# Patient Record
Sex: Male | Born: 1990 | Hispanic: No
Health system: Southern US, Community
[De-identification: ages and names within clinical notes are randomized; demographics above are authoritative.]

## PROBLEM LIST (undated history)

## (undated) DIAGNOSIS — I456 Pre-excitation syndrome: Principal | ICD-10-CM

## (undated) DIAGNOSIS — R002 Palpitations: Secondary | ICD-10-CM

## (undated) HISTORY — DX: Palpitations: R00.2

## (undated) HISTORY — DX: Pre-excitation syndrome: I45.6

## (undated) HISTORY — PX: ABLATION: SHX5711

---
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
## 2012-02-14 ENCOUNTER — Emergency Department (HOSPITAL_COMMUNITY)
Admission: EM | Admit: 2012-02-14 | Discharge: 2012-02-14 | Disposition: A | Discharge status: Home / Self Care | Payer: Self-pay | Attending: Emergency Medicine | Admitting: Emergency Medicine

## 2012-02-14 ENCOUNTER — Encounter (HOSPITAL_COMMUNITY): Payer: Self-pay

## 2012-02-14 DIAGNOSIS — J029 Acute pharyngitis, unspecified: Secondary | ICD-10-CM | POA: Insufficient documentation

## 2012-02-14 DIAGNOSIS — J3489 Other specified disorders of nose and nasal sinuses: Secondary | ICD-10-CM | POA: Insufficient documentation

## 2012-02-14 DIAGNOSIS — J40 Bronchitis, not specified as acute or chronic: Secondary | ICD-10-CM | POA: Insufficient documentation

## 2012-02-14 DIAGNOSIS — R05 Cough: Secondary | ICD-10-CM | POA: Insufficient documentation

## 2012-02-14 DIAGNOSIS — Z8679 Personal history of other diseases of the circulatory system: Secondary | ICD-10-CM | POA: Insufficient documentation

## 2012-02-14 DIAGNOSIS — R062 Wheezing: Secondary | ICD-10-CM | POA: Insufficient documentation

## 2012-02-14 DIAGNOSIS — R509 Fever, unspecified: Secondary | ICD-10-CM | POA: Insufficient documentation

## 2012-02-14 DIAGNOSIS — R059 Cough, unspecified: Secondary | ICD-10-CM | POA: Insufficient documentation

## 2012-02-14 MED ORDER — ALBUTEROL SULFATE HFA 108 (90 BASE) MCG/ACT IN AERS
2.0000 | INHALATION_SPRAY | RESPIRATORY_TRACT | Status: DC | PRN
  Administered 2012-02-14: 2 via RESPIRATORY_TRACT
  Filled 2012-02-14: qty 6.7

## 2012-02-14 MED ORDER — PREDNISONE 20 MG PO TABS
60.0000 mg | ORAL_TABLET | Freq: Once | ORAL | Status: AC
  Administered 2012-02-14: 60 mg via ORAL
  Filled 2012-02-14: qty 3

## 2012-02-14 NOTE — ED Provider Notes (Signed)
History     CSN: [id number]  Arrival date & time 02/14/12  2147   First MD Initiated Contact with Patient 02/14/12 2227      Chief Complaint  Patient presents with  . URI   HPI  History provided by the patient. Patient is a 21 year old male who presents with complaints of cough and wheezing for the past 2 days. Symptoms began gradually and have been associated with some rhinorrhea and occasional sore throat. Patient's girlfriend has recently been sick with similar URI type symptoms. He reports having some fevers and chills at home. He has been using Tylenol and ibuprofen occasionally. Last dose was around 4 PM. he has no prior history of asthma or breathing issues.     Past Medical History  Diagnosis Date  . Palpitations   . Wolff-Parkinson-White (WPW) syndrome 07/13/2010    Past Surgical History  Procedure Date  . Appendectomy sept 13 2007    Family History  Problem Relation Age of Onset  . Diabetes Mother     History  Substance Use Topics  . Smoking status: Never Smoker   . Smokeless tobacco: Not on file  . Alcohol Use: No      Review of Systems  Constitutional: Positive for fever and chills. Negative for diaphoresis and appetite change.  HENT: Positive for sore throat and rhinorrhea. Negative for ear pain and congestion.   Respiratory: Positive for cough and wheezing. Negative for shortness of breath.   Cardiovascular: Negative for chest pain and palpitations.  Gastrointestinal: Negative for nausea, vomiting and diarrhea.  Skin: Negative for rash.    Allergies  Review of patient's allergies indicates no known allergies.  Home Medications   Current Outpatient Rx  Name  Route  Sig  Dispense  Refill  . ACETAMINOPHEN 500 MG PO TABS   Oral   Take 1,000 mg by mouth every 6 (six) hours as needed. Pain         . IBUPROFEN 200 MG PO TABS   Oral   Take 400 mg by mouth every 6 (six) hours as needed. Pain           BP 131/75  Pulse 80  Temp 98.3 F  (36.8 C) (Oral)  Resp 16  SpO2 100%  Physical Exam  Nursing note and vitals reviewed. Constitutional: He is oriented to person, place, and time. He appears well-developed and well-nourished. No distress.  HENT:  Head: Normocephalic.  Neck: Normal range of motion. Neck supple.       No meningeal sign  Cardiovascular: Normal rate and regular rhythm.   No murmur heard. Pulmonary/Chest: Effort normal. No respiratory distress. He has wheezes. He has no rales.  Abdominal: Soft. There is no tenderness. There is no rebound and no guarding.  Neurological: He is alert and oriented to person, place, and time.  Skin: Skin is warm.  Psychiatric: He has a normal mood and affect. His behavior is normal.    ED Course  Procedures    1. Bronchitis       MDM  10:40PM patient seen and evaluated. Patient well-appearing and nontoxic. Patient with symptoms and exam consistent with bronchitis. We'll provide albuterol inhaler and give one dose of prednisone.        Angus Seller, [place] 02/15/12 [phone]

## 2012-02-14 NOTE — ED Notes (Signed)
Pt c/o wheezing that strated today at work.pt sts has not fell well x 2days.VSS.PWD

## 2012-02-14 NOTE — ED Notes (Signed)
Pt reports "feeling chest tightness and I was wheezing earlier today.'  Pt reports spitting up yellow sputum, sore throat, and chills/fever.

## 2012-02-15 NOTE — ED Provider Notes (Signed)
Medical screening examination/treatment/procedure(s) were performed by non-physician practitioner and as supervising physician I was immediately available for consultation/collaboration.    Gilma Bessette L Eutha Cude, MD 02/15/12 0304 

## 2013-11-11 ENCOUNTER — Encounter (HOSPITAL_COMMUNITY): Payer: Self-pay | Admitting: Emergency Medicine

## 2013-11-11 ENCOUNTER — Emergency Department (HOSPITAL_COMMUNITY): Payer: Medicaid Other

## 2013-11-11 ENCOUNTER — Emergency Department (HOSPITAL_COMMUNITY)
Admission: EM | Admit: 2013-11-11 | Discharge: 2013-11-11 | Disposition: A | Discharge status: Home / Self Care | Payer: Self-pay | Attending: Emergency Medicine | Admitting: Emergency Medicine

## 2013-11-11 DIAGNOSIS — R509 Fever, unspecified: Secondary | ICD-10-CM | POA: Insufficient documentation

## 2013-11-11 DIAGNOSIS — F17200 Nicotine dependence, unspecified, uncomplicated: Secondary | ICD-10-CM | POA: Insufficient documentation

## 2013-11-11 DIAGNOSIS — B9789 Other viral agents as the cause of diseases classified elsewhere: Secondary | ICD-10-CM | POA: Insufficient documentation

## 2013-11-11 DIAGNOSIS — IMO0001 Reserved for inherently not codable concepts without codable children: Secondary | ICD-10-CM | POA: Insufficient documentation

## 2013-11-11 DIAGNOSIS — Z8679 Personal history of other diseases of the circulatory system: Secondary | ICD-10-CM | POA: Insufficient documentation

## 2013-11-11 DIAGNOSIS — B349 Viral infection, unspecified: Secondary | ICD-10-CM

## 2013-11-11 DIAGNOSIS — M791 Myalgia, unspecified site: Secondary | ICD-10-CM

## 2013-11-11 LAB — URINALYSIS, ROUTINE W REFLEX MICROSCOPIC
BILIRUBIN URINE: NEGATIVE
Glucose, UA: NEGATIVE mg/dL
HGB URINE DIPSTICK: NEGATIVE
Ketones, ur: 15 mg/dL — AB
Leukocytes, UA: NEGATIVE
NITRITE: NEGATIVE
PH: 6 (ref 5.0–8.0)
Protein, ur: 30 mg/dL — AB
SPECIFIC GRAVITY, URINE: 1.039 — AB (ref 1.005–1.030)
Urobilinogen, UA: 1 mg/dL (ref 0.0–1.0)

## 2013-11-11 LAB — COMPREHENSIVE METABOLIC PANEL
ALBUMIN: 3.9 g/dL (ref 3.5–5.2)
ALK PHOS: 81 U/L (ref 39–117)
ALT: 13 U/L (ref 0–53)
AST: 14 U/L (ref 0–37)
Anion gap: 12 (ref 5–15)
BILIRUBIN TOTAL: 0.6 mg/dL (ref 0.3–1.2)
BUN: 12 mg/dL (ref 6–23)
CHLORIDE: 102 meq/L (ref 96–112)
CO2: 26 mEq/L (ref 19–32)
Calcium: 9.5 mg/dL (ref 8.4–10.5)
Creatinine, Ser: 0.98 mg/dL (ref 0.50–1.35)
GFR calc Af Amer: >90 mL/min (ref >90)
GFR calc non Af Amer: >90 mL/min (ref >90)
Glucose, Bld: 90 mg/dL (ref 70–99)
POTASSIUM: 4.4 meq/L (ref 3.7–5.3)
SODIUM: 140 meq/L (ref 137–147)
Total Protein: 7.5 g/dL (ref 6.0–8.3)

## 2013-11-11 LAB — RAPID STREP SCREEN (MED CTR MEBANE ONLY): Streptococcus, Group A Screen (Direct): NEGATIVE

## 2013-11-11 LAB — CBC WITH DIFFERENTIAL/PLATELET
BASOS ABS: 0 10*3/uL (ref 0.0–0.1)
BASOS PCT: 0 % (ref 0–1)
EOS ABS: 0 10*3/uL (ref 0.0–0.7)
EOS PCT: 0 % (ref 0–5)
HEMATOCRIT: 48 % (ref 39.0–52.0)
Hemoglobin: 16 g/dL (ref 13.0–17.0)
LYMPHS ABS: 2.1 10*3/uL (ref 0.7–4.0)
LYMPHS PCT: 23 % (ref 12–46)
MCH: 30.1 pg (ref 26.0–34.0)
MCHC: 33.3 g/dL (ref 30.0–36.0)
MCV: 90.2 fL (ref 78.0–100.0)
MONO ABS: 1.6 10*3/uL — AB (ref 0.1–1.0)
MONOS PCT: 18 % — AB (ref 3–12)
Neutro Abs: 5.4 10*3/uL (ref 1.7–7.7)
Neutrophils Relative %: 59 % (ref 43–77)
Platelets: 167 10*3/uL (ref 150–400)
RBC: 5.32 MIL/uL (ref 4.22–5.81)
RDW: 13.8 % (ref 11.5–15.5)
WBC: 9.1 10*3/uL (ref 4.0–10.5)

## 2013-11-11 LAB — URINE MICROSCOPIC-ADD ON

## 2013-11-11 MED ORDER — KETOROLAC TROMETHAMINE 60 MG/2ML IM SOLN
60.0000 mg | Freq: Once | INTRAMUSCULAR | Status: AC
  Administered 2013-11-11: 60 mg via INTRAMUSCULAR
  Filled 2013-11-11: qty 2

## 2013-11-11 MED ORDER — IBUPROFEN 800 MG PO TABS
800.0000 mg | ORAL_TABLET | Freq: Once | ORAL | Status: AC
Start: 2013-11-11 — End: 2013-11-11
  Administered 2013-11-11: 800 mg via ORAL
  Filled 2013-11-11: qty 2

## 2013-11-11 NOTE — Discharge Instructions (Signed)
Viral Infections You declined lumbar puncture today. Follow up with your doctor. Use tylenol or motrin as needed for fever or pain. Return to the ED if you develop new or worsening symptoms. A viral infection can be caused by different types of viruses.Most viral infections are not serious and resolve on their own. However, some infections may cause severe symptoms and may lead to further complications. SYMPTOMS Viruses can frequently cause:  Minor sore throat.  Aches and pains.  Headaches.  Runny nose.  Different types of rashes.  Watery eyes.  Tiredness.  Cough.  Loss of appetite.  Gastrointestinal infections, resulting in nausea, vomiting, and diarrhea. These symptoms do not respond to antibiotics because the infection is not caused by bacteria. However, you might catch a bacterial infection following the viral infection. This is sometimes called a "superinfection." Symptoms of such a bacterial infection may include:  Worsening sore throat with pus and difficulty swallowing.  Swollen neck glands.  Chills and a high or persistent fever.  Severe headache.  Tenderness over the sinuses.  Persistent overall ill feeling (malaise), muscle aches, and tiredness (fatigue).  Persistent cough.  Yellow, green, or brown mucus production with coughing. HOME CARE INSTRUCTIONS   Only take over-the-counter or prescription medicines for pain, discomfort, diarrhea, or fever as directed by your caregiver.  Drink enough water and fluids to keep your urine clear or pale yellow. Sports drinks can provide valuable electrolytes, sugars, and hydration.  Get plenty of rest and maintain proper nutrition. Soups and broths with crackers or rice are fine. SEEK IMMEDIATE MEDICAL CARE IF:   You have severe headaches, shortness of breath, chest pain, neck pain, or an unusual rash.  You have uncontrolled vomiting, diarrhea, or you are unable to keep down fluids.  You or your child has an oral  temperature above 102 F (38.9 C), not controlled by medicine.  Your baby is older than 3 months with a rectal temperature of 102 F (38.9 C) or higher.  Your baby is 373 months old or younger with a rectal temperature of 100.4 F (38 C) or higher. MAKE SURE YOU:   Understand these instructions.  Will watch your condition.  Will get help right away if you are not doing well or get worse. Document Released: 12/23/2004 Document Revised: 06/07/2011 Document Reviewed: 07/20/2010 [facility] Patient Information 2015 [employer], [place]. This information is not intended to replace advice given to you by your health care provider. Make sure you discuss any questions you have with your health care provider.

## 2013-11-11 NOTE — ED Notes (Signed)
Ambulated pt down hallway. Pt's gait was steady. No signs of dizziness.

## 2013-11-11 NOTE — ED Notes (Signed)
Patient presents with fever and body aches.  +diahhrea

## 2013-11-11 NOTE — ED Notes (Signed)
Gave pt water for fluid challenge 

## 2013-11-11 NOTE — ED Provider Notes (Signed)
CSN: [id number]     Arrival date & time 11/11/13  0350 History   First MD Initiated Contact with Patient 11/11/13 [phone]     Chief Complaint  Patient presents with  . Fever  . Generalized Body Aches     (Consider location/radiation/quality/duration/timing/severity/associated sxs/prior Treatment) HPI Comments: Patient presents with a two-day history of body aches, diarrhea and fever. States he's had 2 episodes of nonbloody diarrhea since yesterday. Complains of diffuse body aches worse in his lower 70s. Denies any focal weakness, numbness or tingling. Denies any nausea or vomiting. Denies abdominal pain. No chest pain or shortness of breath. He endorses a sore throat, dry cough and rhinorrhea. No sick contacts. No recent travel Travel. No other medical problems. Denies taking any regular medications. Denies any recent antibiotic use. Complains of body aches all over. endorses minimal headache. No neck pain or stiffness. Denies IV drug use.  The history is provided by the patient and a relative.    Past Medical History  Diagnosis Date  . Palpitations   . Wolff-Parkinson-White (WPW) syndrome 07/13/2010   Past Surgical History  Procedure Laterality Date  . Appendectomy  sept 13 2007   Family History  Problem Relation Age of Onset  . Diabetes Mother    History  Substance Use Topics  . Smoking status: Current Every Day Smoker  . Smokeless tobacco: Not on file  . Alcohol Use: Yes    Review of Systems  Constitutional: Positive for fever, activity change, appetite change and fatigue.  HENT: Positive for congestion, rhinorrhea and sore throat.   Respiratory: Negative for cough, chest tightness and shortness of breath.   Gastrointestinal: Positive for diarrhea. Negative for nausea, vomiting and abdominal pain.  Genitourinary: Negative for dysuria and hematuria.  Musculoskeletal: Positive for arthralgias and myalgias. Negative for neck pain and neck stiffness.  Neurological: Positive for  weakness and headaches. Negative for light-headedness.  A complete 10 system review of systems was obtained and all systems are negative except as noted in the HPI and PMH.      Allergies  Review of patient's allergies indicates no known allergies.  Home Medications   Prior to Admission medications   Medication Sig Start Date End Date Taking? Authorizing Provider  acetaminophen (TYLENOL) 500 MG tablet Take 1,000 mg by mouth every 6 (six) hours as needed. Pain   Yes Historical Provider, MD  ibuprofen (ADVIL,MOTRIN) 200 MG tablet Take 400 mg by mouth every 6 (six) hours as needed. Pain   Yes Historical Provider, MD   BP 107/67  Pulse 49  Temp(Src) 98.1 F (36.7 C) (Oral)  Resp 20  Ht 6\' 1"  (1.854 m)  Wt 185 lb (83.915 kg)  BMI 24.41 kg/m2  SpO2 100% Physical Exam  Nursing note and vitals reviewed. Constitutional: He is oriented to person, place, and time. He appears well-developed and well-nourished. No distress.  HENT:  Head: Normocephalic and atraumatic.  Mouth/Throat: No oropharyngeal exudate.  Mild erythema in oropharynx. No asymmetry  Eyes: Conjunctivae and EOM are normal. Pupils are equal, round, and reactive to light.  Neck: Normal range of motion. Neck supple.  No meningismus. Full range of motion without meningismus  Cardiovascular: Normal rate, regular rhythm, normal heart sounds and intact distal pulses.   No murmur heard. Pulmonary/Chest: Effort normal and breath sounds normal. No respiratory distress.  Abdominal: Soft. There is no tenderness. There is no rebound and no guarding.  Musculoskeletal: Normal range of motion. He exhibits no edema and no tenderness.  Neurological: He  is alert and oriented to person, place, and time. No cranial nerve deficit. He exhibits normal muscle tone. Coordination normal.  No ataxia on finger to nose bilaterally. No pronator drift. 5/5 strength throughout. CN 2-12 intact. Negative Romberg. Equal grip strength. Sensation intact. Gait  is normal.   Skin: Skin is warm.  Psychiatric: He has a normal mood and affect. His behavior is normal.    ED Course  Procedures (including critical care time) Labs Review Labs Reviewed  CBC WITH DIFFERENTIAL - Abnormal; Notable for the following:    Monocytes Relative 18 (*)    Monocytes Absolute 1.6 (*)    All other components within normal limits  URINALYSIS, ROUTINE W REFLEX MICROSCOPIC - Abnormal; Notable for the following:    Color, Urine AMBER (*)    Specific Gravity, Urine 1.039 (*)    Ketones, ur 15 (*)    Protein, ur 30 (*)    All other components within normal limits  RAPID STREP SCREEN  CULTURE, GROUP A STREP  COMPREHENSIVE METABOLIC PANEL  URINE MICROSCOPIC-ADD ON    Imaging Review Dg Chest 2 View  11/11/2013   CLINICAL DATA:  Generalized body aches and fever  EXAM: CHEST  2 VIEW  COMPARISON:  None.  FINDINGS: The heart size and mediastinal contours are within normal limits. Both lungs are clear. No pleural effusion or pneumothorax. The visualized skeletal structures are unremarkable.  IMPRESSION: Normal chest radiographs.   Electronically Signed   By: Amie Portland M.D.   On: 11/11/2013 08:54     EKG Interpretation   Date/Time:  Sunday November 11 2013 09:31:28 EDT Ventricular Rate:  49 PR Interval:  143 QRS Duration: 93 QT Interval:  448 QTC Calculation: 404 R Axis:   96 Text Interpretation:  Sinus bradycardia Borderline right axis deviation ST  elev, probable normal early repol pattern No previous ECGs available  Confirmed by Tytianna Greenley  MD, Sopheap Boehle ([id number]) on 11/11/2013 10:09:24 AM      MDM   Final diagnoses:  Viral syndrome  Myalgia   Two-day history of body aches, fever, diarrhea. Febrile at 101.6. Denies headache or meningismus. Nonfocal neuro exam. Abdomen soft and nontender  Labs unremarkable. No leukocytosis. Fever has improved.  Suspect viral syndrome. Patient with no headache or neck pain currently. No meningismus. Nonfocal neuro exam.  Discussed remote possibility of meningitis with the patient. He appears well with no meningismus and no headaches. Suspicion for bacterial meningitis is low. Vrial meningitis considered and a lumbar puncture offered to patient which he declines. He understands viral meningitis will be treated the same as a viral syndrome with supportive care. Patient agrees low suspicion for bacterial meningitis. He declines lumbar puncture at this time. He is awake, alert, has no head or neck pain no meningismus. His fever has resolved.  Discussed supportive care for viral syndrome. Tylenol and Motrin as needed for pain and fever. By mouth hydration at home. Followup with urgent care center this week. Return to ED with worsening headache, chills, confusion, change in mental status concerns.  BP 107/67  Pulse 49  Temp(Src) 98.1 F (36.7 C) (Oral)  Resp 20  Ht 6\' 1"  (1.854 m)  Wt 185 lb (83.915 kg)  BMI 24.41 kg/m2  SpO2 100%   Glynn Octave, MD 11/11/13 1529

## 2013-11-11 NOTE — ED Notes (Signed)
MD at bedside. 

## 2013-11-13 LAB — CULTURE, GROUP A STREP

## 2014-01-16 ENCOUNTER — Emergency Department (HOSPITAL_COMMUNITY)
Admission: EM | Admit: 2014-01-16 | Discharge: 2014-01-16 | Disposition: A | Discharge status: Home / Self Care | Payer: Medicaid Other | Attending: Emergency Medicine | Admitting: Emergency Medicine

## 2014-01-16 ENCOUNTER — Encounter (HOSPITAL_COMMUNITY): Payer: Self-pay | Admitting: Emergency Medicine

## 2014-01-16 DIAGNOSIS — Z720 Tobacco use: Secondary | ICD-10-CM | POA: Insufficient documentation

## 2014-01-16 DIAGNOSIS — Y929 Unspecified place or not applicable: Secondary | ICD-10-CM | POA: Insufficient documentation

## 2014-01-16 DIAGNOSIS — S56912A Strain of unspecified muscles, fascia and tendons at forearm level, left arm, initial encounter: Secondary | ICD-10-CM | POA: Insufficient documentation

## 2014-01-16 DIAGNOSIS — Y939 Activity, unspecified: Secondary | ICD-10-CM | POA: Insufficient documentation

## 2014-01-16 DIAGNOSIS — Z8679 Personal history of other diseases of the circulatory system: Secondary | ICD-10-CM | POA: Insufficient documentation

## 2014-01-16 DIAGNOSIS — X58XXXA Exposure to other specified factors, initial encounter: Secondary | ICD-10-CM | POA: Insufficient documentation

## 2014-01-16 MED ORDER — HYDROCODONE-ACETAMINOPHEN 5-325 MG PO TABS: 1.0000 | ORAL_TABLET | Freq: Four times a day (QID) | ORAL | Status: DC | PRN

## 2014-01-16 MED ORDER — IBUPROFEN 800 MG PO TABS: 800.0000 mg | ORAL_TABLET | Freq: Three times a day (TID) | ORAL | Status: DC

## 2014-01-16 NOTE — Discharge Instructions (Signed)
Call for a follow up appointment with a Family or Primary Care Provider.  Return if Symptoms worsen.   Take medication as prescribed.  Do not drink alcohol, operate heavy machinery while taking narcotic pain medication. Ice your arm 3-4 times a day.

## 2014-01-16 NOTE — ED Notes (Signed)
Patient states L arm pain from wrist to elbow.  Patient denies injury.   Patient states woke up with the pain.    No other symptoms.

## 2014-01-16 NOTE — ED Provider Notes (Signed)
CSN: [id number]     Arrival date & time 01/16/14  0716 History   None    Chief Complaint  Patient presents with  . Arm Pain     (Consider location/radiation/quality/duration/timing/severity/associated sxs/prior Treatment) HPI Comments: Patient presents with left arm myalgias since this morning. Reports discomfort upon waking. Discomfort worsened with movement and pressure. Denies injury to the area. Denies fever, chills. Denies paresthesias, rash.  Patient is a 23 y.o. male presenting with arm pain. The history is provided by the patient. No language interpreter was used.  Arm Pain This is a new problem. The current episode started today. The problem occurs constantly. The problem has been unchanged. Associated symptoms include myalgias. Pertinent negatives include no arthralgias, chills, fever, joint swelling or rash. He has tried nothing for the symptoms. The treatment provided no relief.    Past Medical History  Diagnosis Date  . Palpitations   . Wolff-Parkinson-White (WPW) syndrome 07/13/2010   Past Surgical History  Procedure Laterality Date  . Appendectomy  sept 13 2007   Family History  Problem Relation Age of Onset  . Diabetes Mother    History  Substance Use Topics  . Smoking status: Current Every Day Smoker -- 0.50 packs/day    Types: Cigarettes  . Smokeless tobacco: Not on file  . Alcohol Use: Yes     Comment: socially    Review of Systems  Constitutional: Negative for fever and chills.  Musculoskeletal: Positive for myalgias. Negative for arthralgias and joint swelling.  Skin: Negative for color change, rash and wound.      Allergies  Review of patient's allergies indicates no known allergies.  Home Medications   Prior to Admission medications   Medication Sig Start Date End Date Taking? Authorizing Provider  acetaminophen (TYLENOL) 500 MG tablet Take 1,000 mg by mouth every 6 (six) hours as needed. Pain    Historical Provider, MD  ibuprofen  (ADVIL,MOTRIN) 200 MG tablet Take 400 mg by mouth every 6 (six) hours as needed. Pain    Historical Provider, MD   BP 132/83  Pulse 63  Temp(Src) 98 F (36.7 C) (Oral)  Resp 20  SpO2 98% Physical Exam  Nursing note and vitals reviewed. Constitutional: He is oriented to person, place, and time. He appears well-developed and well-nourished. No distress.  HENT:  Head: Normocephalic and atraumatic.  Neck: Neck supple.  Cardiovascular: Normal rate and regular rhythm.   Pulses:      Radial pulses are 2+ on the right side, and 2+ on the left side.  Pulmonary/Chest: Effort normal. No respiratory distress.  Musculoskeletal:       Left forearm: He exhibits tenderness. He exhibits no bony tenderness, no swelling, no edema, no deformity and no laceration.       Arms: Left forearm: mild soft tissue tenderness with palpation. No overlying erythema or rash. Full ROM of elbow and wrist. Compartments soft, normal sensation distally. Good cap refill.  Neurological: He is oriented to person, place, and time.  Skin: Skin is warm and dry. He is not diaphoretic.  Psychiatric: He has a normal mood and affect. His behavior is normal.    ED Course  Procedures (including critical care time) Labs Review Labs Reviewed - No data to display  Imaging Review No results found.   EKG Interpretation None      MDM   Final diagnoses:  Muscle strain of forearm, left, initial encounter   Patient presents with left forearm soft tissue discomfort worsened with movement and palpation.  No sign of infectious process. Neurovascularly intact distally. Good range of motion. Likely muscle strain, plan to treat with anti-inflammatories, referral to hand specialist for further evaluation of discomfort. No bony discomfort or obvious abnormality, don't feel x-rays indicated at this time. Discussed treatment plan with the patient. Return precautions given. Reports understanding and no other concerns at this time.  Patient  is stable for discharge at this time. Meds given in ED:  Medications - No data to display  Discharge Medication List as of 01/16/2014  8:26 AM    START taking these medications   Details  HYDROcodone-acetaminophen (NORCO/VICODIN) 5-325 MG per tablet Take 1 tablet by mouth every 6 (six) hours as needed for moderate pain or severe pain., Starting 01/16/2014, Until Discontinued, Print    !! ibuprofen (ADVIL,MOTRIN) 800 MG tablet Take 1 tablet (800 mg total) by mouth 3 (three) times daily., Starting 01/16/2014, Until Discontinued, Print     !! - Potential duplicate medications found. Please discuss with provider.        Mellody DrownLauren Jamarkus Lisbon, PA-C 01/16/14 1624

## 2014-01-17 NOTE — ED Provider Notes (Signed)
Medical screening examination/treatment/procedure(s) were performed by non-physician practitioner and as supervising physician I was immediately available for consultation/collaboration.   EKG Interpretation None       Derwood KaplanAnkit Newman Waren, MD 01/17/14 [phone]

## 2015-03-28 ENCOUNTER — Encounter (HOSPITAL_COMMUNITY): Payer: Self-pay

## 2015-03-28 ENCOUNTER — Emergency Department (HOSPITAL_COMMUNITY)
Admission: EM | Admit: 2015-03-28 | Discharge: 2015-03-28 | Disposition: A | Discharge status: Home / Self Care | Payer: Medicaid Other | Attending: Emergency Medicine | Admitting: Emergency Medicine

## 2015-03-28 ENCOUNTER — Emergency Department (HOSPITAL_COMMUNITY): Payer: Medicaid Other

## 2015-03-28 DIAGNOSIS — Y998 Other external cause status: Secondary | ICD-10-CM | POA: Insufficient documentation

## 2015-03-28 DIAGNOSIS — Y92410 Unspecified street and highway as the place of occurrence of the external cause: Secondary | ICD-10-CM | POA: Insufficient documentation

## 2015-03-28 DIAGNOSIS — F17210 Nicotine dependence, cigarettes, uncomplicated: Secondary | ICD-10-CM | POA: Insufficient documentation

## 2015-03-28 DIAGNOSIS — S99922A Unspecified injury of left foot, initial encounter: Secondary | ICD-10-CM | POA: Insufficient documentation

## 2015-03-28 DIAGNOSIS — S93402A Sprain of unspecified ligament of left ankle, initial encounter: Secondary | ICD-10-CM

## 2015-03-28 DIAGNOSIS — Y9339 Activity, other involving climbing, rappelling and jumping off: Secondary | ICD-10-CM | POA: Insufficient documentation

## 2015-03-28 DIAGNOSIS — Z8679 Personal history of other diseases of the circulatory system: Secondary | ICD-10-CM | POA: Insufficient documentation

## 2015-03-28 DIAGNOSIS — Z791 Long term (current) use of non-steroidal anti-inflammatories (NSAID): Secondary | ICD-10-CM | POA: Insufficient documentation

## 2015-03-28 NOTE — ED Provider Notes (Signed)
CSN: [id number]     Arrival date & time 03/28/15  1121 History  By signing my name below, I, Marica OtterNusrat Rahman, attest that this documentation has been prepared under the direction and in the presence of Teressa LowerVrinda Daryana Whirley, NP. Electronically Signed: Marica OtterNusrat Rahman, ED Scribe. 03/28/2015. 11:36 AM.   Chief Complaint  Patient presents with  . Ankle Pain   The history is provided by the patient. No language interpreter was used.   PCP: Dorrene GermanAVBUERE,EDWIN A, MD HPI Comments: Patrick Buck is a 24 y.o. male, with PMHx noted below, who presents to the Emergency Department complaining of traumatic, worsening, constant, left ankle and left foot pain with associated swelling onset this morning. Pt reports he leaped over a vehicle ([employer]) and landed on both feet yesterday, however, the pain and swelling did not begin until this morning. Pt denies any chronic health conditions. Pt denies any other Sx at this time.   Past Medical History  Diagnosis Date  . Palpitations   . Wolff-Parkinson-White (WPW) syndrome 07/13/2010   Past Surgical History  Procedure Laterality Date  . Appendectomy  sept 13 2007  . Ablation      heart ablation 2011   Family History  Problem Relation Age of Onset  . Diabetes Mother    Social History  Substance Use Topics  . Smoking status: Current Every Day Smoker -- 0.50 packs/day    Types: Cigarettes  . Smokeless tobacco: None  . Alcohol Use: Yes     Comment: socially    Review of Systems  Constitutional: Negative for fever.  Musculoskeletal: Positive for joint swelling and arthralgias (left ankle and foot pain).  All other systems reviewed and are negative.  Allergies  Review of patient's allergies indicates no known allergies.  Home Medications   Prior to Admission medications   Medication Sig Start Date End Date Taking? Authorizing Provider  acetaminophen (TYLENOL) 500 MG tablet Take 1,000 mg by mouth every 6 (six) hours as needed. Pain   Yes Historical Provider, MD   ibuprofen (ADVIL,MOTRIN) 200 MG tablet Take 400 mg by mouth every 6 (six) hours as needed. Pain   Yes Historical Provider, MD  ibuprofen (ADVIL,MOTRIN) 800 MG tablet Take 1 tablet (800 mg total) by mouth 3 (three) times daily. 01/16/14  Yes Mellody DrownLauren Parker, PA-C  HYDROcodone-acetaminophen (NORCO/VICODIN) 5-325 MG per tablet Take 1 tablet by mouth every 6 (six) hours as needed for moderate pain or severe pain. 01/16/14   Mellody DrownLauren Parker, PA-C   Triage Vitals: BP 117/72 mmHg  Pulse 75  Temp(Src) 98.6 F (37 C) (Oral)  Resp 14  Ht 6' (1.829 m)  Wt 200 lb (90.719 kg)  BMI 27.12 kg/m2  SpO2 100% Physical Exam  Constitutional: He is oriented to person, place, and time. He appears well-developed and well-nourished.  HENT:  Head: Normocephalic.  Eyes: EOM are normal.  Neck: Normal range of motion.  Pulmonary/Chest: Effort normal.  Abdominal: He exhibits no distension.  Musculoskeletal: Normal range of motion.  Tender on the left lateral ankle and left lateral foot. Pulses intact  Neurological: He is alert and oriented to person, place, and time.  Psychiatric: He has a normal mood and affect.  Nursing note and vitals reviewed.  ED Course  Procedures (including critical care time) DIAGNOSTIC STUDIES: Oxygen Saturation is 100% on ra, nl by my interpretation.    COORDINATION OF CARE: 11:33 AM: Discussed treatment plan which includes imaging with pt at bedside; patient verbalizes understanding and agrees with treatment plan.  Imaging Review  Dg Ankle Complete Left  03/28/2015  CLINICAL DATA:  Left ankle injury, pain and swelling. EXAM: LEFT ANKLE COMPLETE - 3+ VIEW COMPARISON:  None. FINDINGS: There is no evidence of fracture, dislocation, or joint effusion. There is no evidence of arthropathy or other focal bone abnormality. Soft tissues are unremarkable. IMPRESSION: Negative. Electronically Signed   By: Bary Richard M.D.   On: 03/28/2015 12:21   Dg Foot Complete Left  03/28/2015   CLINICAL DATA:  Left foot injury last night, pain and swelling. EXAM: LEFT FOOT - COMPLETE 3+ VIEW COMPARISON:  None. FINDINGS: There is no evidence of fracture or dislocation. There is no evidence of arthropathy or other focal bone abnormality. Soft tissues are unremarkable. IMPRESSION: Negative. Electronically Signed   By: Bary Richard M.D.   On: 03/28/2015 12:22   I have personally reviewed and evaluated these images as part of my medical decision-making.  MDM   Final diagnoses:  Ankle sprain, left, initial encounter    No acute bony abnormality noted. Pt given aso and crutches and ortho referral. Neurovascularly intact  I personally performed the services described in this documentation, which was scribed in my presence. The recorded information has been reviewed and is accurate.   Teressa Lower, NP 03/28/15 1228  Laurence Spates, MD 03/29/15 [phone]

## 2015-03-28 NOTE — ED Notes (Signed)
Please see provider note for assessment.  

## 2015-03-28 NOTE — ED Notes (Signed)
Pt. Presents with complaint of L ankle and foot pain starting this AM. Pt. States he jumped over a car last night as a bet and pain and swelling did not begin until this AM.

## 2015-03-28 NOTE — Discharge Instructions (Signed)
Ankle Sprain  An ankle sprain is an injury to the strong, fibrous tissues (ligaments) that hold the bones of your ankle joint together.   CAUSES  An ankle sprain is usually caused by a fall or by twisting your ankle. Ankle sprains most commonly occur when you step on the outer edge of your foot, and your ankle turns inward. People who participate in sports are more prone to these types of injuries.   SYMPTOMS    Pain in your ankle. The pain may be present at rest or only when you are trying to stand or walk.   Swelling.   Bruising. Bruising may develop immediately or within 1 to 2 days after your injury.   Difficulty standing or walking, particularly when turning corners or changing directions.  DIAGNOSIS   Your caregiver will ask you details about your injury and perform a physical exam of your ankle to determine if you have an ankle sprain. During the physical exam, your caregiver will press on and apply pressure to specific areas of your foot and ankle. Your caregiver will try to move your ankle in certain ways. An X-ray exam may be done to be sure a bone was not broken or a ligament did not separate from one of the bones in your ankle (avulsion fracture).   TREATMENT   Certain types of braces can help stabilize your ankle. Your caregiver can make a recommendation for this. Your caregiver may recommend the use of medicine for pain. If your sprain is severe, your caregiver may refer you to a surgeon who helps to restore function to parts of your skeletal system (orthopedist) or a physical therapist.  HOME CARE INSTRUCTIONS    Apply ice to your injury for 1-2 days or as directed by your caregiver. Applying ice helps to reduce inflammation and pain.    Put ice in a plastic bag.    Place a towel between your skin and the bag.    Leave the ice on for 15-20 minutes at a time, every 2 hours while you are awake.   Only take over-the-counter or prescription medicines for pain, discomfort, or fever as directed by  your caregiver.   Elevate your injured ankle above the level of your heart as much as possible for 2-3 days.   If your caregiver recommends crutches, use them as instructed. Gradually put weight on the affected ankle. Continue to use crutches or a cane until you can walk without feeling pain in your ankle.   If you have a plaster splint, wear the splint as directed by your caregiver. Do not rest it on anything harder than a pillow for the first 24 hours. Do not put weight on it. Do not get it wet. You may take it off to take a shower or bath.   You may have been given an elastic bandage to wear around your ankle to provide support. If the elastic bandage is too tight (you have numbness or tingling in your foot or your foot becomes cold and blue), adjust the bandage to make it comfortable.   If you have an air splint, you may blow more air into it or let air out to make it more comfortable. You may take your splint off at night and before taking a shower or bath. Wiggle your toes in the splint several times per day to decrease swelling.  SEEK MEDICAL CARE IF:    You have rapidly increasing bruising or swelling.   Your toes feel   extremely cold or you lose feeling in your foot.   Your pain is not relieved with medicine.  SEEK IMMEDIATE MEDICAL CARE IF:   Your toes are numb or blue.   You have severe pain that is increasing.  MAKE SURE YOU:    Understand these instructions.   Will watch your condition.   Will get help right away if you are not doing well or get worse.     This information is not intended to replace advice given to you by your health care provider. Make sure you discuss any questions you have with your health care provider.     Document Released: 03/15/2005 Document Revised: 04/05/2014 Document Reviewed: 03/27/2011  [employer]vier Interactive Patient Education 2016 [employer]

## 2015-09-22 ENCOUNTER — Encounter (HOSPITAL_BASED_OUTPATIENT_CLINIC_OR_DEPARTMENT_OTHER): Payer: Self-pay | Admitting: *Deleted

## 2015-09-22 ENCOUNTER — Emergency Department (HOSPITAL_BASED_OUTPATIENT_CLINIC_OR_DEPARTMENT_OTHER)
Admission: EM | Admit: 2015-09-22 | Discharge: 2015-09-22 | Disposition: A | Discharge status: Home / Self Care | Payer: Self-pay | Attending: Emergency Medicine | Admitting: Emergency Medicine

## 2015-09-22 ENCOUNTER — Emergency Department (HOSPITAL_BASED_OUTPATIENT_CLINIC_OR_DEPARTMENT_OTHER): Payer: Self-pay

## 2015-09-22 DIAGNOSIS — J181 Lobar pneumonia, unspecified organism: Secondary | ICD-10-CM | POA: Insufficient documentation

## 2015-09-22 DIAGNOSIS — Z79899 Other long term (current) drug therapy: Secondary | ICD-10-CM | POA: Insufficient documentation

## 2015-09-22 DIAGNOSIS — F17210 Nicotine dependence, cigarettes, uncomplicated: Secondary | ICD-10-CM | POA: Insufficient documentation

## 2015-09-22 DIAGNOSIS — J189 Pneumonia, unspecified organism: Secondary | ICD-10-CM

## 2015-09-22 LAB — RAPID STREP SCREEN (MED CTR MEBANE ONLY): Streptococcus, Group A Screen (Direct): NEGATIVE

## 2015-09-22 MED ORDER — DOXYCYCLINE HYCLATE 100 MG PO TABS
100.0000 mg | ORAL_TABLET | Freq: Once | ORAL | Status: AC
  Administered 2015-09-22: 100 mg via ORAL
  Filled 2015-09-22: qty 1

## 2015-09-22 MED ORDER — IBUPROFEN 800 MG PO TABS
800.0000 mg | ORAL_TABLET | Freq: Once | ORAL | Status: AC
  Administered 2015-09-22: 800 mg via ORAL
  Filled 2015-09-22: qty 1

## 2015-09-22 MED ORDER — DOXYCYCLINE HYCLATE 100 MG PO CAPS: 100.0000 mg | ORAL_CAPSULE | Freq: Two times a day (BID) | ORAL | Status: DC

## 2015-09-22 NOTE — ED Notes (Signed)
[facility], NAD, calm, interactive, resps e/u, speaking in clear complete sentences.

## 2015-09-22 NOTE — ED Notes (Signed)
Pt c/o flu like symptoms x 3 days, body aches , fever sore throat congestion

## 2015-09-22 NOTE — ED Provider Notes (Signed)
CSN: [id number]     Arrival date & time 09/22/15  0002 History  By signing my name below, I, Rosario AdieWilliam Andrew Hiatt, attest that this documentation has been prepared under the direction and in the presence of Paula LibraJohn Liora Myles, MD.  Electronically Signed: Rosario AdieWilliam Andrew Hiatt, ED Scribe. 09/22/2015. 12:39 AM.   Chief Complaint  Patient presents with  . Generalized Body Aches   The history is provided by the patient. No language interpreter was used.   HPI Comments: Patrick Buck is a 25 y.o. male who presents to the Emergency Department with a three-day history of body aches, sore throat (that has partially resolved), subjective fever, chills, productive cough with green and yellow sputum, mild shortness of breath and loss of appetite. He has tried taking acetaminophen, Motrin, and Theraflu Without adequate relief. He denies nausea, vomiting or diarrhea.   Past Medical History  Diagnosis Date  . Palpitations   . Wolff-Parkinson-White (WPW) syndrome 07/13/2010   Past Surgical History  Procedure Laterality Date  . Appendectomy  sept 13 2007  . Ablation      heart ablation 2011   Family History  Problem Relation Age of Onset  . Diabetes Mother    Social History  Substance Use Topics  . Smoking status: Current Every Day Smoker -- 0.50 packs/day    Types: Cigarettes  . Smokeless tobacco: None  . Alcohol Use: Yes     Comment: socially    Review of Systems A complete 10 system review of systems was obtained and all systems are negative except as noted in the HPI and PMH.  Allergies  Review of patient's allergies indicates no known allergies.  Home Medications   Prior to Admission medications   Medication Sig Start Date End Date Taking? Authorizing Provider  ibuprofen (ADVIL,MOTRIN) 200 MG tablet Take 400 mg by mouth every 6 (six) hours as needed. Pain   Yes Historical Provider, MD  Phenylephrine-Pheniramine-DM [facility](THERAFLU COLD & COUGH PO) Take by mouth.   Yes Historical Provider, MD   Pseudoephedrine-APAP-DM (DAYQUIL MULTI-SYMPTOM PO) Take by mouth.   Yes Historical Provider, MD  acetaminophen (TYLENOL) 500 MG tablet Take 1,000 mg by mouth every 6 (six) hours as needed. Pain    Historical Provider, MD  ibuprofen (ADVIL,MOTRIN) 800 MG tablet Take 1 tablet (800 mg total) by mouth 3 (three) times daily. 01/16/14   Lauren Parker, PA-C   BP 114/63 mmHg  Pulse 68  Temp(Src) 100 F (37.8 C) (Oral)  Resp 16  Ht 6' (1.829 m)  Wt 175 lb (79.379 kg)  BMI 23.73 kg/m2  SpO2 99%   Physical Exam General: Well-developed, well-nourished male in no acute distress; appearance consistent with age of record HENT: normocephalic; atraumatic; pharyngeal erythema with slight left tonsillar exudate Eyes: pupils equal, round and reactive to light; extraocular muscles intact Neck: supple; no lymphadenopathy  Heart: regular rate and rhythm; no murmurs, rubs or gallops Lungs: clear to auscultation bilaterally; increased breath sounds in left lower lobe Abdomen: soft; nondistended; nontender; no masses or hepatosplenomegaly; bowel sounds present Extremities: No deformity; full range of motion; pulses normal Neurologic: Awake, alert and oriented; motor function intact in all extremities and symmetric; no facial droop Skin: Warm and dry Psychiatric: Normal mood and affect  ED Course  Procedures (including critical care time)   MDM   Nursing notes and vitals signs, including pulse oximetry, reviewed.  Summary of this visit's results, reviewed by myself:  Labs:  Results for orders placed or performed during the hospital encounter of  09/22/15 (from the past 24 hour(s))  Rapid strep screen     Status: None   Collection Time: 09/22/15 12:30 AM  Result Value Ref Range   Streptococcus, Group A Screen (Direct) NEGATIVE NEGATIVE    Imaging Studies: Dg Chest 2 View  09/22/2015  CLINICAL DATA:  25 year old male with fever and cough EXAM: CHEST  2 VIEW COMPARISON:  Chest radiograph dated  08/16 FINDINGS: Two views of the chest demonstrate focal area of increased density in the left lower lobe posteriorly, likely in the superior segment most compatible with developing pneumonia. Clinical correlation and follow-up recommended. The right lung is clear. There is no pleural effusion or pneumothorax. The cardiac silhouette is within normal limits. No acute osseous pathology. IMPRESSION: Left lower lobe pneumonia.  Follow-up recommended. Electronically Signed   By: Elgie CollardArash  Radparvar M.D.   On: 09/22/2015 01:46   Suspect viral URI with superimposed bacterial pneumonia.  Final diagnoses:  Left lower lobe pneumonia   I personally performed the services described in this documentation, which was scribed in my presence. The recorded information has been reviewed and is accurate.     Paula LibraJohn Libia Fazzini, MD 09/22/15 [phone]

## 2015-09-24 LAB — CULTURE, GROUP A STREP (THRC)

## 2015-10-02 ENCOUNTER — Emergency Department (HOSPITAL_COMMUNITY)
Admission: EM | Admit: 2015-10-02 | Discharge: 2015-10-03 | Disposition: A | Discharge status: Home / Self Care | Payer: Self-pay | Attending: Emergency Medicine | Admitting: Emergency Medicine

## 2015-10-02 ENCOUNTER — Emergency Department (HOSPITAL_COMMUNITY): Payer: Self-pay

## 2015-10-02 ENCOUNTER — Encounter (HOSPITAL_COMMUNITY): Payer: Self-pay | Admitting: *Deleted

## 2015-10-02 ENCOUNTER — Encounter (HOSPITAL_COMMUNITY)
Admission: EM | Disposition: A | Discharge status: Home / Self Care | Payer: Self-pay | Source: Home / Self Care | Attending: Emergency Medicine

## 2015-10-02 ENCOUNTER — Emergency Department (HOSPITAL_COMMUNITY): Payer: Self-pay | Admitting: Anesthesiology

## 2015-10-02 DIAGNOSIS — W260XXA Contact with knife, initial encounter: Secondary | ICD-10-CM | POA: Insufficient documentation

## 2015-10-02 DIAGNOSIS — F17210 Nicotine dependence, cigarettes, uncomplicated: Secondary | ICD-10-CM | POA: Insufficient documentation

## 2015-10-02 DIAGNOSIS — S56522A Laceration of other extensor muscle, fascia and tendon at forearm level, left arm, initial encounter: Secondary | ICD-10-CM | POA: Insufficient documentation

## 2015-10-02 DIAGNOSIS — S51812A Laceration without foreign body of left forearm, initial encounter: Secondary | ICD-10-CM

## 2015-10-02 HISTORY — PX: I&D EXTREMITY: SHX5045

## 2015-10-02 LAB — CBC WITH DIFFERENTIAL/PLATELET
BASOS ABS: 0.1 10*3/uL (ref 0.0–0.1)
Basophils Relative: 0 %
EOS ABS: 0.1 10*3/uL (ref 0.0–0.7)
EOS PCT: 0 %
HCT: 49.6 % (ref 39.0–52.0)
Hemoglobin: 17.2 g/dL — ABNORMAL HIGH (ref 13.0–17.0)
LYMPHS PCT: 17 %
Lymphs Abs: 3.1 10*3/uL (ref 0.7–4.0)
MCH: 30.3 pg (ref 26.0–34.0)
MCHC: 34.7 g/dL (ref 30.0–36.0)
MCV: 87.5 fL (ref 78.0–100.0)
MONO ABS: 0.9 10*3/uL (ref 0.1–1.0)
Monocytes Relative: 5 %
Neutro Abs: 14 10*3/uL — ABNORMAL HIGH (ref 1.7–7.7)
Neutrophils Relative %: 78 %
PLATELETS: 272 10*3/uL (ref 150–400)
RBC: 5.67 MIL/uL (ref 4.22–5.81)
RDW: 13 % (ref 11.5–15.5)
WBC: 18 10*3/uL — ABNORMAL HIGH (ref 4.0–10.5)

## 2015-10-02 LAB — BASIC METABOLIC PANEL
ANION GAP: 9 (ref 5–15)
BUN: 9 mg/dL (ref 6–20)
CALCIUM: 9.6 mg/dL (ref 8.9–10.3)
CO2: 23 mmol/L (ref 22–32)
Chloride: 106 mmol/L (ref 101–111)
Creatinine, Ser: 0.89 mg/dL (ref 0.61–1.24)
GFR calc Af Amer: >60 mL/min (ref >60)
GLUCOSE: 129 mg/dL — AB (ref 65–99)
Potassium: 3.3 mmol/L — ABNORMAL LOW (ref 3.5–5.1)
SODIUM: 138 mmol/L (ref 135–145)

## 2015-10-02 SURGERY — IRRIGATION AND DEBRIDEMENT EXTREMITY
Anesthesia: General | Site: Arm Upper | Laterality: Left

## 2015-10-02 MED ORDER — LIDOCAINE-EPINEPHRINE (PF) 2 %-1:200000 IJ SOLN
20.0000 mL | Freq: Once | INTRAMUSCULAR | Status: DC
  Filled 2015-10-02: qty 20

## 2015-10-02 MED ORDER — SUCCINYLCHOLINE CHLORIDE 20 MG/ML IJ SOLN
INTRAMUSCULAR | Status: DC | PRN
Start: 2015-10-02 — End: 2015-10-03
  Administered 2015-10-02: 100 mg via INTRAVENOUS

## 2015-10-02 MED ORDER — DEXAMETHASONE SODIUM PHOSPHATE 4 MG/ML IJ SOLN
INTRAMUSCULAR | Status: DC | PRN
  Administered 2015-10-02: 10 mg via INTRAVENOUS

## 2015-10-02 MED ORDER — FENTANYL CITRATE (PF) 250 MCG/5ML IJ SOLN
INTRAMUSCULAR | Status: AC
  Filled 2015-10-02: qty 5

## 2015-10-02 MED ORDER — TETANUS-DIPHTH-ACELL PERTUSSIS 5-2.5-18.5 LF-MCG/0.5 IM SUSP
0.5000 mL | Freq: Once | INTRAMUSCULAR | Status: AC
  Administered 2015-10-02: 0.5 mL via INTRAMUSCULAR
  Filled 2015-10-02: qty 0.5

## 2015-10-02 MED ORDER — SUCCINYLCHOLINE CHLORIDE 200 MG/10ML IV SOSY
PREFILLED_SYRINGE | INTRAVENOUS | Status: AC
  Filled 2015-10-02: qty 10

## 2015-10-02 MED ORDER — PROPOFOL 10 MG/ML IV BOLUS
INTRAVENOUS | Status: DC | PRN
  Administered 2015-10-02: 50 mg via INTRAVENOUS
  Administered 2015-10-02: 200 mg via INTRAVENOUS

## 2015-10-02 MED ORDER — BUPIVACAINE-EPINEPHRINE (PF) 0.25% -1:200000 IJ SOLN
INTRAMUSCULAR | Status: AC
  Filled 2015-10-02: qty 30

## 2015-10-02 MED ORDER — CEFAZOLIN SODIUM 1 G IJ SOLR
INTRAMUSCULAR | Status: DC | PRN
  Administered 2015-10-02: 2 g via INTRAMUSCULAR

## 2015-10-02 MED ORDER — HYDROMORPHONE HCL 1 MG/ML IJ SOLN
1.0000 mg | Freq: Once | INTRAMUSCULAR | Status: AC
  Administered 2015-10-02: 1 mg via INTRAVENOUS
  Filled 2015-10-02: qty 1

## 2015-10-02 MED ORDER — LACTATED RINGERS IV SOLN
INTRAVENOUS | Status: DC | PRN
  Administered 2015-10-02 (×2): via INTRAVENOUS

## 2015-10-02 MED ORDER — ONDANSETRON HCL 4 MG/2ML IJ SOLN
INTRAMUSCULAR | Status: DC | PRN
  Administered 2015-10-02: 4 mg via INTRAVENOUS

## 2015-10-02 MED ORDER — ONDANSETRON HCL 4 MG/2ML IJ SOLN
4.0000 mg | Freq: Once | INTRAMUSCULAR | Status: AC
  Administered 2015-10-02: 4 mg via INTRAVENOUS
  Filled 2015-10-02: qty 2

## 2015-10-02 MED ORDER — BUPIVACAINE-EPINEPHRINE (PF) 0.5% -1:200000 IJ SOLN
INTRAMUSCULAR | Status: AC
  Filled 2015-10-02: qty 30

## 2015-10-02 MED ORDER — SODIUM CHLORIDE 0.9 % IV BOLUS (SEPSIS)
1000.0000 mL | Freq: Once | INTRAVENOUS | Status: AC
Start: 2015-10-02 — End: 2015-10-02
  Administered 2015-10-02: 1000 mL via INTRAVENOUS

## 2015-10-02 MED ORDER — PROPOFOL 10 MG/ML IV BOLUS
INTRAVENOUS | Status: AC
  Filled 2015-10-02: qty 20

## 2015-10-02 MED ORDER — MIDAZOLAM HCL 2 MG/2ML IJ SOLN
INTRAMUSCULAR | Status: AC
  Filled 2015-10-02: qty 2

## 2015-10-02 MED ORDER — LIDOCAINE-EPINEPHRINE (PF) 2 %-1:200000 IJ SOLN
20.0000 mL | Freq: Once | INTRAMUSCULAR | Status: AC
  Administered 2015-10-02: 20 mL

## 2015-10-02 MED ORDER — LACTATED RINGERS IV SOLN
INTRAVENOUS | Status: DC
  Administered 2015-10-02: 22:00:00 via INTRAVENOUS

## 2015-10-02 MED ORDER — LIDOCAINE HCL (PF) 1 % IJ SOLN
INTRAMUSCULAR | Status: AC
  Filled 2015-10-02: qty 30

## 2015-10-02 MED ORDER — LIDOCAINE HCL (CARDIAC) 20 MG/ML IV SOLN
INTRAVENOUS | Status: DC | PRN
  Administered 2015-10-02: 100 mg via INTRAVENOUS

## 2015-10-02 MED ORDER — ONDANSETRON HCL 4 MG/2ML IJ SOLN
INTRAMUSCULAR | Status: AC
  Filled 2015-10-02: qty 2

## 2015-10-02 MED ORDER — 0.9 % SODIUM CHLORIDE (POUR BTL) OPTIME
TOPICAL | Status: DC | PRN
  Administered 2015-10-02: 1000 mL

## 2015-10-02 MED ORDER — CEFAZOLIN SODIUM 1 G IJ SOLR
INTRAMUSCULAR | Status: AC
  Filled 2015-10-02: qty 20

## 2015-10-02 MED ORDER — GLYCOPYRROLATE 0.2 MG/ML IJ SOLN
INTRAMUSCULAR | Status: DC | PRN
  Administered 2015-10-02: 0.2 mg via INTRAVENOUS

## 2015-10-02 MED ORDER — MIDAZOLAM HCL 5 MG/5ML IJ SOLN
INTRAMUSCULAR | Status: DC | PRN
  Administered 2015-10-02: 2 mg via INTRAVENOUS

## 2015-10-02 MED ORDER — LIDOCAINE 2% (20 MG/ML) 5 ML SYRINGE
INTRAMUSCULAR | Status: AC
  Filled 2015-10-02: qty 5

## 2015-10-02 MED ORDER — FENTANYL CITRATE (PF) 250 MCG/5ML IJ SOLN
INTRAMUSCULAR | Status: DC | PRN
  Administered 2015-10-02: 150 ug via INTRAVENOUS
  Administered 2015-10-02: 100 ug via INTRAVENOUS

## 2015-10-02 MED ORDER — DEXAMETHASONE SODIUM PHOSPHATE 10 MG/ML IJ SOLN
INTRAMUSCULAR | Status: AC
  Filled 2015-10-02: qty 1

## 2015-10-02 MED ORDER — GLYCOPYRROLATE 0.2 MG/ML IV SOSY
PREFILLED_SYRINGE | INTRAVENOUS | Status: AC
  Filled 2015-10-02: qty 3

## 2015-10-02 SURGICAL SUPPLY — 52 items
BANDAGE COBAN STERILE 2 (GAUZE/BANDAGES/DRESSINGS) IMPLANT
BLADE MINI RND TIP GREEN BEAV (BLADE) IMPLANT
BLADE SURG 15 STRL LF DISP TIS (BLADE) ×1 IMPLANT
BLADE SURG 15 STRL SS (BLADE) ×3
BNDG COHESIVE 4X5 TAN STRL (GAUZE/BANDAGES/DRESSINGS) ×3 IMPLANT
BNDG GAUZE ELAST 4 BULKY (GAUZE/BANDAGES/DRESSINGS) ×6 IMPLANT
CHLORAPREP W/TINT 26ML (MISCELLANEOUS) ×3 IMPLANT
CORDS BIPOLAR (ELECTRODE) ×2 IMPLANT
COVER BACK TABLE 60X90IN (DRAPES) ×3 IMPLANT
COVER MAYO STAND STRL (DRAPES) ×3 IMPLANT
CUFF TOURNIQUET SINGLE 18IN (TOURNIQUET CUFF) ×2 IMPLANT
DRAPE EXTREMITY T 121X128X90 (DRAPE) ×3 IMPLANT
DRAPE SURG 17X23 STRL (DRAPES) ×3 IMPLANT
DRSG ADAPTIC 3X8 NADH LF (GAUZE/BANDAGES/DRESSINGS) ×2 IMPLANT
DRSG EMULSION OIL 3X3 NADH (GAUZE/BANDAGES/DRESSINGS) ×3 IMPLANT
GAUZE SPONGE 4X4 12PLY STRL (GAUZE/BANDAGES/DRESSINGS) ×3 IMPLANT
GLOVE BIO SURGEON STRL SZ7.5 (GLOVE) ×3 IMPLANT
GLOVE BIOGEL PI IND STRL 6.5 (GLOVE) IMPLANT
GLOVE BIOGEL PI IND STRL 7.0 (GLOVE) ×1 IMPLANT
GLOVE BIOGEL PI IND STRL 8 (GLOVE) ×1 IMPLANT
GLOVE BIOGEL PI INDICATOR 6.5 (GLOVE) ×2
GLOVE BIOGEL PI INDICATOR 7.0 (GLOVE) ×4
GLOVE BIOGEL PI INDICATOR 8 (GLOVE) ×2
GLOVE ECLIPSE 6.5 STRL STRAW (GLOVE) ×3 IMPLANT
GOWN STRL REUS W/ TWL LRG LVL3 (GOWN DISPOSABLE) ×2 IMPLANT
GOWN STRL REUS W/TWL LRG LVL3 (GOWN DISPOSABLE) ×6
GOWN STRL REUS W/TWL XL LVL3 (GOWN DISPOSABLE) ×3 IMPLANT
NDL HYPO 25X1 1.5 SAFETY (NEEDLE) IMPLANT
NEEDLE HYPO 25X1 1.5 SAFETY (NEEDLE) ×3 IMPLANT
NS IRRIG 1000ML POUR BTL (IV SOLUTION) ×3 IMPLANT
PACK BASIN DAY SURGERY FS (CUSTOM PROCEDURE TRAY) ×3 IMPLANT
PACK ORTHO EXTREMITY (CUSTOM PROCEDURE TRAY) ×2 IMPLANT
PADDING CAST ABS 4INX4YD NS (CAST SUPPLIES)
PADDING CAST ABS COTTON 4X4 ST (CAST SUPPLIES) IMPLANT
RUBBERBAND STERILE (MISCELLANEOUS) IMPLANT
SET IRRIG Y TYPE TUR BLADDER L (SET/KITS/TRAYS/PACK) IMPLANT
SPLINT PLASTER CAST XFAST 3X15 (CAST SUPPLIES) IMPLANT
SPLINT PLASTER CAST XFAST 4X15 (CAST SUPPLIES) IMPLANT
SPLINT PLASTER XTRA FAST SET 4 (CAST SUPPLIES) ×2
SPLINT PLASTER XTRA FASTSET 3X (CAST SUPPLIES) ×2
STAPLER SKIN 35 WIDE (STAPLE) ×2 IMPLANT
STOCKINETTE 6  STRL (DRAPES) ×2
STOCKINETTE 6 STRL (DRAPES) ×1 IMPLANT
SUT VIC AB 2-0 CT1 27 (SUTURE) ×24
SUT VIC AB 2-0 CT1 TAPERPNT 27 (SUTURE) IMPLANT
SUT VICRYL RAPIDE 4-0 (SUTURE) IMPLANT
SUT VICRYL RAPIDE 4/0 PS 2 (SUTURE) IMPLANT
SYR BULB 3OZ (MISCELLANEOUS) ×3 IMPLANT
SYRINGE 10CC LL (SYRINGE) ×2 IMPLANT
TOWEL OR 17X24 6PK STRL BLUE (TOWEL DISPOSABLE) ×3 IMPLANT
TOWEL OR NON WOVEN STRL DISP B (DISPOSABLE) ×3 IMPLANT
UNDERPAD 30X30 (UNDERPADS AND DIAPERS) ×3 IMPLANT

## 2015-10-02 NOTE — ED Notes (Signed)
Pt. Transported to CT at this time.  
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
## 2015-10-02 NOTE — ED Notes (Signed)
Applied Ace bandage to left forearm to apply pressure over bandaged arterial bleed per Provider instruction
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
## 2015-10-02 NOTE — Anesthesia Preprocedure Evaluation (Addendum)
Anesthesia Evaluation  Patient identified by MRN, date of birth, ID band Patient awake    Reviewed: Allergy & Precautions, H&P , NPO status , Patient's Chart, lab work & pertinent test results  Airway Mallampati: II  TM Distance: >3 FB Neck ROM: Full    Dental no notable dental hx. (+) Teeth Intact, Dental Advisory Given   Pulmonary Current Smoker,    Pulmonary exam normal breath sounds clear to auscultation       Cardiovascular negative cardio ROS   Rhythm:Regular Rate:Normal     Neuro/Psych negative neurological ROS  negative psych ROS   GI/Hepatic negative GI ROS, Neg liver ROS,   Endo/Other  negative endocrine ROS  Renal/GU negative Renal ROS  negative genitourinary   Musculoskeletal   Abdominal   Peds  Hematology negative hematology ROS (+)   Anesthesia Other Findings   Reproductive/Obstetrics negative OB ROS                             Anesthesia Physical Anesthesia Plan  ASA: II  Anesthesia Plan: General   Post-op Pain Management:    Induction: Intravenous  Airway Management Planned: Oral ETT  Additional Equipment:   Intra-op Plan:   Post-operative Plan: Extubation in OR  Informed Consent: I have reviewed the patients History and Physical, chart, labs and discussed the procedure including the risks, benefits and alternatives for the proposed anesthesia with the patient or authorized representative who has indicated his/her understanding and acceptance.   Dental advisory given  Plan Discussed with: CRNA  Anesthesia Plan Comments:         Anesthesia Quick Evaluation  
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
## 2015-10-02 NOTE — Anesthesia Procedure Notes (Signed)
Procedure Name: Intubation Date/Time: 10/02/2015 10:57 PM Performed by: Melina SchoolsBANKS, Loudon Krakow J Pre-anesthesia Checklist: Patient identified, Emergency Drugs available, Suction available, Patient being monitored and Timeout performed Patient Re-evaluated:Patient Re-evaluated prior to inductionOxygen Delivery Method: Circle system utilized Preoxygenation: Pre-oxygenation with 100% oxygen Intubation Type: IV induction, Rapid sequence and Cricoid Pressure applied Ventilation: Mask ventilation without difficulty Laryngoscope Size: Mac and 3 Grade View: Grade I Tube type: Oral Tube size: 7.0 mm Number of attempts: 1 Airway Equipment and Method: Stylet Placement Confirmation: ETT inserted through vocal cords under direct vision,  positive ETCO2 and breath sounds checked- equal and bilateral Secured at: 23 cm Tube secured with: Tape Dental Injury: Teeth and Oropharynx as per pre-operative assessment

## 2015-10-03 ENCOUNTER — Encounter (HOSPITAL_COMMUNITY): Payer: Self-pay | Admitting: Orthopedic Surgery

## 2015-10-03 MED ORDER — CEPHALEXIN 500 MG PO CAPS: 500.0000 mg | ORAL_CAPSULE | Freq: Four times a day (QID) | ORAL | Status: DC

## 2015-10-03 MED ORDER — HYDROMORPHONE HCL 1 MG/ML IJ SOLN: 0.2500 mg | INTRAMUSCULAR | Status: DC | PRN

## 2015-10-03 MED ORDER — OXYCODONE-ACETAMINOPHEN 5-325 MG PO TABS: 1.0000 | ORAL_TABLET | ORAL | Status: DC | PRN

## 2015-10-03 NOTE — Transfer of Care (Signed)
Immediate Anesthesia Transfer of Care Note  Patient: Patrick Buck  Procedure(s) Performed: Procedure(s) with comments: IRRIGATION AND DEBRIDEMENT FOREARM LACERATIONS (Left) - IRRIGATION AND DEBRIDEMENT FOREARM LACERATIONS  Patient Location: PACU  Anesthesia Type:General  Level of Consciousness: awake, alert , oriented, patient cooperative and responds to stimulation  Airway & Oxygen Therapy: Patient Spontanous Breathing and Patient connected to nasal cannula oxygen  Post-op Assessment: Report given to RN, Post -op Vital signs reviewed and stable and Patient moving all extremities X 4  Post vital signs: Reviewed and stable  Last Vitals:  Filed Vitals:   10/02/15 2100 10/03/15 0023  BP: 132/92   Pulse: 85 106  Temp:    Resp: 17 16    Last Pain:  Filed Vitals:   10/03/15 0024  PainSc: 10-Worst pain ever         Complications: No apparent anesthesia complications

## 2015-10-03 NOTE — Anesthesia Postprocedure Evaluation (Signed)
Anesthesia Post Note  Patient: Patrick Buck  Procedure(s) Performed: Procedure(s) (LRB): IRRIGATION AND DEBRIDEMENT FOREARM LACERATIONS (Left)  Patient location during evaluation: PACU Anesthesia Type: General Level of consciousness: awake and alert Pain management: pain level controlled Vital Signs Assessment: post-procedure vital signs reviewed and stable Respiratory status: spontaneous breathing, nonlabored ventilation and respiratory function stable Cardiovascular status: blood pressure returned to baseline and stable Postop Assessment: no signs of nausea or vomiting Anesthetic complications: no    Last Vitals:  Filed Vitals:   10/03/15 0030 10/03/15 0048  BP: 138/99 142/94  Pulse: 96 96  Temp:    Resp: 13 17    Last Pain:  Filed Vitals:   10/03/15 0048  PainSc: 0-No pain                 Acelin Ferdig,W. EDMOND

## 2015-10-03 NOTE — Discharge Instructions (Signed)
Discharge Instructions ° ° °You have a dressing with a plaster splint incorporated in it. °Move your fingers as much as possible, making a full fist and fully opening the fist. °Elevate your hand to reduce pain & swelling of the digits.  Ice over the operative site may be helpful to reduce pain & swelling.  DO NOT USE HEAT. °Pain medicine has been prescribed for you.  °Use your medicine as needed over the first 48 hours, and then you can begin to taper your use.  You may use Tylenol in place of your prescribed pain medication, but not IN ADDITION to it. °Leave the dressing in place until you return to our office.  °You may shower, but keep the bandage clean & dry.  °You may drive a car when you are off of prescription pain medications and can safely control your vehicle with both hands. °Our office will call you to arrange follow-up ° ° °Please call [phone] during normal business hours or [phone] after hours for any problems. Including the following: ° °- excessive redness of the incisions °- drainage for more than 4 days °- fever of more than 101.5 F ° °*Please note that pain medications will not be refilled after hours or on weekends. ° °
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
## 2015-10-06 ENCOUNTER — Encounter (HOSPITAL_COMMUNITY): Payer: Self-pay | Admitting: Orthopedic Surgery

## 2015-10-11 ENCOUNTER — Encounter (HOSPITAL_COMMUNITY): Payer: Self-pay | Admitting: Emergency Medicine

## 2015-10-11 ENCOUNTER — Ambulatory Visit (HOSPITAL_COMMUNITY)
Admission: EM | Admit: 2015-10-11 | Discharge: 2015-10-11 | Disposition: A | Discharge status: Home / Self Care | Payer: Medicaid Other | Attending: Emergency Medicine | Admitting: Emergency Medicine

## 2015-10-11 DIAGNOSIS — Z5189 Encounter for other specified aftercare: Secondary | ICD-10-CM

## 2015-10-11 DIAGNOSIS — Z4802 Encounter for removal of sutures: Secondary | ICD-10-CM

## 2015-10-11 MED ORDER — IBUPROFEN 600 MG PO TABS: 600.0000 mg | ORAL_TABLET | Freq: Four times a day (QID) | ORAL | Status: DC | PRN

## 2015-10-11 MED ORDER — HYDROCODONE-ACETAMINOPHEN 5-325 MG PO TABS: 1.0000 | ORAL_TABLET | Freq: Four times a day (QID) | ORAL | Status: DC | PRN

## 2015-10-11 NOTE — Discharge Instructions (Signed)
We have replaced the splint today. Please keep this dry and leave it on until you see Dr. Janee Mornhompson. Call Dr. Carollee Massedhompson's office Monday morning. You should be seen on either Monday or Tuesday. You can take Tylenol or ibuprofen as needed for pain during the day. Applying ice on top of the splint will also help with pain and swelling. Use the hydrocodone every 6 hours as needed for severe pain. I recommend using this at bedtime as it will make you tired.  You can also call our office at [phone] and ask to speak to [facility] with questions about financial assistance and the orange card.

## 2015-10-11 NOTE — ED Notes (Signed)
Paged ortho and spoke to Kelly Servicesjennifer, ortho.  Instructed to speak to dr Piedad Climeshonig on arrival to department for specifics

## 2015-10-11 NOTE — ED Notes (Signed)
Ortho tech at bedside 
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
## 2015-10-11 NOTE — ED Notes (Signed)
Patient here today due to "cast" getting wet in shower this morning and patient removed cast prior to coming to [facility]cc today.  Patient has staples visible in left forearm.  Left hand is puffy.   Left radial pulse is strong, 2+.  Arm and hand is warm to touch.  Patient does not know where instructions from hospital visit are.  Looking at previous record, patient seen in ed on 7/6 and went to operating room for exploratory surgery/repair of wound by dr Janee Mornthompson.  Patient has not called to get an appointment with anyone.

## 2015-10-22 ENCOUNTER — Ambulatory Visit: Payer: Medicaid Other | Attending: Orthopedic Surgery | Admitting: Occupational Therapy

## 2015-10-22 DIAGNOSIS — M25642 Stiffness of left hand, not elsewhere classified: Secondary | ICD-10-CM | POA: Insufficient documentation

## 2015-10-22 DIAGNOSIS — M79632 Pain in left forearm: Secondary | ICD-10-CM

## 2015-10-22 DIAGNOSIS — M25632 Stiffness of left wrist, not elsewhere classified: Secondary | ICD-10-CM | POA: Insufficient documentation

## 2015-10-22 DIAGNOSIS — M6281 Muscle weakness (generalized): Secondary | ICD-10-CM | POA: Insufficient documentation

## 2015-10-22 NOTE — Patient Instructions (Addendum)
WEARING SCHEDULE:  Wear splint at ALL times except for hygiene care  PURPOSE:  To prevent movement and for protection until injury can heal  CARE OF SPLINT:  Keep splint away from heat sources including: stove, radiator or furnace, or a car in sunlight. The splint can melt and will no longer fit you properly  Keep away from pets and children  Clean the splint with rubbing alcohol 1-2 times per day.  * During this time, make sure you also clean your hand/arm as instructed by your therapist and/or perform dressing changes as needed. Then dry hand/arm completely before replacing splint. (When cleaning hand/arm, keep it immobilized in same position until splint is replaced)  PRECAUTIONS/POTENTIAL PROBLEMS: *If you notice or experience increased pain, swelling, numbness, or a lingering reddened area from the splint: Contact your therapist immediately by calling [phone]. You must wear the splint for protection, but we will get you scheduled for adjustments as quickly as possible.  (If only straps or hooks need to be replaced and NO adjustments to the splint need to be made, just call the office ahead and let them know you are coming in)  If you have any medical concerns or signs of infection, please call your doctor immediately  Perform finger exercises as instructed by therapist while wearing splint  Bend and straighten the tip joints of your fingers while wearing the splint, Then while wearing splint, Bend both joints of fingers then exercise the tip joint separately, 10-15 reps 4-6x day
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
## 2015-11-05 ENCOUNTER — Ambulatory Visit: Payer: Medicaid Other | Attending: Orthopedic Surgery | Admitting: Occupational Therapy

## 2015-11-05 DIAGNOSIS — M6281 Muscle weakness (generalized): Secondary | ICD-10-CM | POA: Insufficient documentation

## 2015-11-05 DIAGNOSIS — M25632 Stiffness of left wrist, not elsewhere classified: Secondary | ICD-10-CM | POA: Insufficient documentation

## 2015-11-05 DIAGNOSIS — M79632 Pain in left forearm: Secondary | ICD-10-CM | POA: Insufficient documentation

## 2015-11-05 DIAGNOSIS — M25642 Stiffness of left hand, not elsewhere classified: Secondary | ICD-10-CM | POA: Insufficient documentation

## 2015-11-05 NOTE — Patient Instructions (Signed)
AROM: Wrist Extension   .  With _left ___ palm down, bend wrist up. Repeat __15__ times per set.  Do __4-6__ sessions per day.     AROM: Forearm Pronation / Supination   With _left ___ arm in handshake position, slowly rotate palm down until stretch is felt. Relax. Then rotate palm up until stretch is felt. Repeat _15___ times per set. Do _4-6___ sessions per day.  Copyright  VHI. All rights reserved.     MP Flexion (Active Isolated)     Bend all joints at the big knuckles, 10-15 reps 4-6x day for 15 reps    Actively bend fingers of  hand. Start with knuckles furthest from palm, and slowly make a fist. Hold __5__ seconds. Relax. Then straighten fingers as far as possible. Repeat _10-15___ times per set.  Do _4-6___ sessions per day.  Copyright  VHI. All rights reserved.

## 2015-11-06 NOTE — Therapy (Signed)
[facility] [address] [place], [place], [address] Phone: [phone]   Fax:  [phone]  Occupational Therapy Treatment  Patient Details  Name: Patrick Buck MRN: [id number] Date of Birth: 07-11-1990 Referring Provider: Dr. Janee Morn  Encounter Date: 11/05/2015      OT End of Session - 11/06/15 1711    Visit Number 2   Number of Visits 4   Date for OT Re-Evaluation 12/23/15   Authorization Type Medicaid, 3 visits approved,    Authorization Time Period pt has applied for orange card   Authorization - Visit Number 1   Authorization - Number of Visits 3   OT Start Time 1403   OT Stop Time 1453   OT Time Calculation (min) 50 min   Activity Tolerance Patient tolerated treatment well   Behavior During Therapy [facility] for tasks assessed/performed      Past Medical History:  Diagnosis Date  . Palpitations   . Wolff-Parkinson-White (WPW) syndrome 07/13/2010    Past Surgical History:  Procedure Laterality Date  . ABLATION     heart ablation 2011  . APPENDECTOMY  sept 13 2007  . I&D EXTREMITY Left 10/02/2015   Procedure: IRRIGATION AND DEBRIDEMENT FOREARM LACERATIONS;  Surgeon: Mack Hook, MD;  Location: [facility] OR;  Service: Orthopedics;  Laterality: Left;  IRRIGATION AND DEBRIDEMENT FOREARM LACERATIONS    There were no vitals filed for this visit.      Subjective Assessment - 11/05/15 1424    Subjective  Pt reports pain in foream   Pertinent History see epic    Patient Stated Goals splint, be able left arm/ hand   Currently in Pain? Yes   Pain Score 5              Treatment: Fluidotherapy x 10 mins to LUE for stiffness and pain relief, no adverse reactions. Ultrasound , 0.8 w/cm 2, 20% x 8 mins to dorsal forearm avoiding remaining scabs on forearm, no adverse reactions. Pt was instructed in HEP for A/ROM out of splint to forearm, wrist and digits. Pt demonstrates significant  stiffness.                  OT Education - 11/06/15 1705    Education provided Yes   Education Details updated HEP for A/ROM exercises out of splint, continue to wear splint between exercises.   Person(s) Educated Patient   Methods Explanation;Demonstration;Verbal cues;Handout   Comprehension Verbalized understanding;Returned demonstration;Verbal cues required          OT Short Term Goals - 11/06/15 1710      OT SHORT TERM GOAL #1   Title I with splint wear care and precautions following 1-2 weeks of wear to ensure proper fit.   Time 4   Period Weeks   Status Achieved     OT SHORT TERM GOAL #2   Title Pt will be independent with inital HEP.   Baseline dependent    Time 4   Period Weeks   Status Achieved           OT Long Term Goals - 11/06/15 1711      OT LONG TERM GOAL #1   Title I with updated HEP.   Baseline dependent   Time 8   Period Weeks   Status On-going     OT LONG TERM GOAL #2   Title Pt will resume use of LUE as a non dominant assist at least 90% of the time for ADLs/ IADLs with  pain less than or equal to 3/10.   Baseline unable to use LUE functionally, pain is 5/10   Time 8   Period Weeks   Status On-going     OT LONG TERM GOAL #3   Title Pt will demonstrate wrist flexion/ extension WFLS for ADLS.   Baseline not tested due to precautions   Time 8   Period Weeks   Status On-going     OT LONG TERM GOAL #4   Title Pt will demonstrate at least 30 lbs grip strength for LUE for increased functional use.   Baseline not tested due to precautions.   Time 8   Period Weeks   Status On-going               Plan - 11/06/15 1706    Clinical Impression Statement Pt is progressing towards goals. He began A/ROM out of the splint as he is now 4 weeks and 5 days post op. weeks post op. Pt demonstrates significant stiffness and increased pain with exercises.   OT Frequency --  3 visits plus eval   OT Duration 8 weeks   OT  Treatment/Interventions Self-care/ADL training;Moist Heat;Fluidtherapy;Splinting;Therapeutic exercises;Patient/family education;Contrast Bath;Ultrasound;Therapeutic exercise;Therapeutic activities;Passive range of motion;Neuromuscular education;Cryotherapy;Parrafin;Energy conservation;Manual Therapy   Plan progress per protocol for extensor tendon repairs Zone VII & VIII as sent by MD   Consulted and Agree with Plan of Care Patient      Patient will benefit from skilled therapeutic intervention in order to improve the following deficits and impairments:  Decreased coordination, Decreased range of motion, Impaired flexibility, Impaired sensation, Increased edema, Decreased endurance, Decreased scar mobility, Impaired UE functional use, Decreased strength  Visit Diagnosis: Muscle weakness (generalized)  Stiffness of joint, hand, left  Stiffness of joint of left forearm  Pain in left forearm    Problem List Patient Active Problem List   Diagnosis Date Noted  . Wolff-Parkinson-White (WPW) syndrome 07/13/2010    Patrick Buck 11/06/2015, 5:16 PM Patrick Buck, Patrick Buck [phone] Phone: [phone] 5:16 PM 08/10/17[facility] [address] [place], [place], [address] Phone: [phone]   Fax:  [phone]  Name: Patrick Buck MRN: [id number] Date of Birth: 08/29/1990

## 2015-11-13 ENCOUNTER — Encounter: Payer: Self-pay | Admitting: Occupational Therapy

## 2015-11-13 ENCOUNTER — Ambulatory Visit: Payer: Medicaid Other | Admitting: Occupational Therapy

## 2015-11-13 DIAGNOSIS — M25632 Stiffness of left wrist, not elsewhere classified: Secondary | ICD-10-CM

## 2015-11-13 DIAGNOSIS — M79632 Pain in left forearm: Secondary | ICD-10-CM

## 2015-11-13 DIAGNOSIS — M6281 Muscle weakness (generalized): Secondary | ICD-10-CM

## 2015-11-13 DIAGNOSIS — M25642 Stiffness of left hand, not elsewhere classified: Secondary | ICD-10-CM

## 2015-11-13 NOTE — Therapy (Signed)
[facility] [address] [place], [place], [address] Phone: [phone]   Fax:  [phone]  Occupational Therapy Treatment  Patient Details  Name: Patrick Buck MRN: [id number] Date of Birth: [birth year] Referring Provider: Dr. Janee Morn  Encounter Date: 11/13/2015      OT End of Session - 11/13/15 1202    Visit Number 3   Number of Visits 4   Date for OT Re-Evaluation 12/23/15   Authorization Type Medicaid, 3 visits approved,    Authorization Time Period pt has applied for orange card   Authorization - Visit Number 2   Authorization - Number of Visits 3   OT Start Time 1108   OT Stop Time 1150   OT Time Calculation (min) 42 min   Activity Tolerance Patient tolerated treatment well   Behavior During Therapy [facility] for tasks assessed/performed      Past Medical History:  Diagnosis Date  . Palpitations   . Wolff-Parkinson-White (WPW) syndrome 07/13/2010    Past Surgical History:  Procedure Laterality Date  . ABLATION     heart ablation 2011  . APPENDECTOMY  sept 13 2007  . I&D EXTREMITY Left 10/02/2015   Procedure: IRRIGATION AND DEBRIDEMENT FOREARM LACERATIONS;  Surgeon: Mack Hook, MD;  Location: [facility] OR;  Service: Orthopedics;  Laterality: Left;  IRRIGATION AND DEBRIDEMENT FOREARM LACERATIONS    There were no vitals filed for this visit.      Subjective Assessment - 11/13/15 1114    Subjective  "It's become un-stiff. Really Un-stiff" re: L hand/extensors. Pain reported left scar area/forearm   Pertinent History see epic    Patient Stated Goals splint, be able to use left arm/ hand   Currently in Pain? Yes   Pain Score 5    Pain Location Other (Comment)  Forearm   Pain Orientation Left   Pain Descriptors / Indicators Aching   Pain Onset 1 to 4 weeks ago   Aggravating Factors  Movement/scar tenderness   Pain Relieving Factors Medicine   Effect of Pain on Daily Activities Limits daily actvities   Multiple  Pain Sites No                      OT Treatments/Exercises (OP) - 11/13/15 0001      ADLs   ADL Comments Reviewed not using left hand for daily activities/no lifting/pushing or pulling. Wear brace between exercise sessions. Pt verbalized understanding of this.     Exercises   Exercises Wrist     Wrist Exercises   Forearm Supination AROM;Left;5 reps;Seated   Wrist Flexion AROM;PROM;Left;10 reps;Seated  Passive flexion exercises to wrist and digits out of splints   Other wrist exercises AROM lumbricals - make a "7" with fingers and then straighten fingers/AROM finger extension.      Fine Motor Coordination   Other Fine Motor Exercises Fluidotherapy x10 min LUE for stiffness/pain relief, no adverse reactions noted. Reivew of HEP as previously issued on 11/05/15. Pt with decreased stiffness noted L hand. He cont to wear splint between ex sessions. Upgraded HEP as pt is 6 wks post-op today as per protocol from Dr Janee Morn.      Sensation Exercises   Desensitization Verbal education of scar management/gentle massage, tapping and rubbing as per pt report of hypersensitive area noted along ulnar forearm scar > than rest of scar.     Modalities   Modalities Fluidotherapy;Ultrasound     Ultrasound   Ultrasound Location L forearm   Ultrasound  Parameters 0.8 w/cm2 w/ 3 Mhz x 8 min    Ultrasound Goals Pain;Other (Comment)  Pain relief, increased circulation, scar management     Splinting   Splinting Splint check. Review splint wear, care and precautions. Remove for ex's/HEP and re-apply between exercise sessions. Pt verbalized understanding in clinic today.   Replaced forearm strap and stockinette                OT Education - 11/13/15 1200    Education provided Yes   Education Details Updated HEP LUE out of splint as per protocol - A/PROM ex's. Wear splint between exercise sessions.   Person(s) Educated Patient   Methods Explanation;Demonstration;Verbal cues;Tactile  cues   Comprehension Verbalized understanding;Returned demonstration;Verbal cues required          OT Short Term Goals - 11/06/15 1710      OT SHORT TERM GOAL #1   Title I with splint wear care and precautions following 1-2 weeks of wear to ensure proper fit.   Time 4   Period Weeks   Status Achieved     OT SHORT TERM GOAL #2   Title Pt will be independent with inital HEP.   Baseline dependent    Time 4   Period Weeks   Status Achieved           OT Long Term Goals - 11/06/15 1711      OT LONG TERM GOAL #1   Title I with updated HEP.   Baseline dependent   Time 8   Period Weeks   Status On-going     OT LONG TERM GOAL #2   Title Pt will resume use of LUE as a non dominant assist at least 90% of the time for ADLs/ IADLs with pain less than or equal to 3/10.   Baseline unable to use LUE functionally, pain is 5/10   Time 8   Period Weeks   Status On-going     OT LONG TERM GOAL #3   Title Pt will demonstrate wrist flexion/ extension WFLS for ADLS.   Baseline not tested due to precautions   Time 8   Period Weeks   Status On-going     OT LONG TERM GOAL #4   Title Pt will demonstrate at least 30 lbs grip strength for LUE for increased functional use.   Baseline not tested due to precautions.   Time 8   Period Weeks   Status On-going               Plan - 11/13/15 1203    Clinical Impression Statement Pt is progressing toward goals. He is 6 weeks post-op today and began upgraded HEP for gentle PROM wrist and digits. He will do this in conjunction with previously issued HEP and wear splint in between exercise sessions. Finger stiffness has improved greatly as pt can make full composite fist w/o difficulty.   Rehab Potential Good   OT Frequency --  3 visits plus eval   OT Duration 8 weeks   OT Treatment/Interventions Self-care/ADL training;Moist Heat;Fluidtherapy;Splinting;Therapeutic exercises;Patient/family education;Contrast Bath;Ultrasound;Therapeutic  exercise;Therapeutic activities;Passive range of motion;Neuromuscular education;Cryotherapy;Parrafin;Energy conservation;Manual Therapy   Plan Progress per protocol for extensor tendon repairs Zone VII & VIII as sent by MD. Anitcipate weaning from splint, strengthening & d/c planning next session.   OT Home Exercise Plan inital HEP for finger ROM  to IP's while wearing splint   Consulted and Agree with Plan of Care Patient      Patient will benefit  from skilled therapeutic intervention in order to improve the following deficits and impairments:  Decreased coordination, Decreased range of motion, Impaired flexibility, Impaired sensation, Increased edema, Decreased endurance, Decreased scar mobility, Impaired UE functional use, Decreased strength  Visit Diagnosis: Muscle weakness (generalized)  Stiffness of joint, hand, left  Stiffness of joint of left forearm  Pain in left forearm    Problem List Patient Active Problem List   Diagnosis Date Noted  . Wolff-Parkinson-White (WPW) syndrome 07/13/2010    Mariam DollarBarnhill, Amy Dionicio StallBeth Dixon, OTR/L 11/13/2015, 12:10 PM  [facility] [address] [place], [place], [address] Phone: [phone]   Fax:  [phone]  Name: Pauline GoodYahya Kershaw MRN: [id number] Date of Birth: 09/10/1990

## 2015-11-20 ENCOUNTER — Encounter: Payer: Medicaid Other | Admitting: Occupational Therapy

## 2015-12-05 ENCOUNTER — Ambulatory Visit: Payer: Medicaid Other | Attending: Orthopedic Surgery | Admitting: Occupational Therapy

## 2015-12-16 ENCOUNTER — Encounter: Payer: Self-pay | Admitting: Occupational Therapy

## 2015-12-29 ENCOUNTER — Ambulatory Visit: Payer: Self-pay | Attending: Orthopedic Surgery | Admitting: Occupational Therapy

## 2019-07-25 ENCOUNTER — Other Ambulatory Visit: Payer: Self-pay

## 2019-07-25 ENCOUNTER — Ambulatory Visit: Payer: Self-pay | Attending: Internal Medicine

## 2019-07-25 DIAGNOSIS — Z20822 Contact with and (suspected) exposure to covid-19: Secondary | ICD-10-CM

## 2019-07-26 LAB — NOVEL CORONAVIRUS, NAA: SARS-CoV-2, NAA: NOT DETECTED

## 2019-07-26 LAB — SARS-COV-2, NAA 2 DAY TAT

## 2020-09-25 ENCOUNTER — Encounter: Payer: Self-pay | Admitting: Occupational Therapy

## 2020-10-27 ENCOUNTER — Encounter (HOSPITAL_BASED_OUTPATIENT_CLINIC_OR_DEPARTMENT_OTHER): Payer: Self-pay | Admitting: *Deleted

## 2020-10-27 ENCOUNTER — Emergency Department (HOSPITAL_BASED_OUTPATIENT_CLINIC_OR_DEPARTMENT_OTHER)
Admission: EM | Admit: 2020-10-27 | Discharge: 2020-10-27 | Disposition: A | Discharge status: Home / Self Care | Payer: Self-pay | Attending: Emergency Medicine | Admitting: Emergency Medicine

## 2020-10-27 ENCOUNTER — Other Ambulatory Visit: Payer: Self-pay

## 2020-10-27 ENCOUNTER — Emergency Department (HOSPITAL_BASED_OUTPATIENT_CLINIC_OR_DEPARTMENT_OTHER): Payer: Self-pay

## 2020-10-27 DIAGNOSIS — R600 Localized edema: Secondary | ICD-10-CM | POA: Insufficient documentation

## 2020-10-27 DIAGNOSIS — W172XXA Fall into hole, initial encounter: Secondary | ICD-10-CM | POA: Insufficient documentation

## 2020-10-27 DIAGNOSIS — T148XXA Other injury of unspecified body region, initial encounter: Secondary | ICD-10-CM

## 2020-10-27 DIAGNOSIS — S92352A Displaced fracture of fifth metatarsal bone, left foot, initial encounter for closed fracture: Secondary | ICD-10-CM | POA: Insufficient documentation

## 2020-10-27 NOTE — Discharge Instructions (Addendum)
I recommend a combination of tylenol and ibuprofen for management of your pain. You can take a low dose of both at the same time. I recommend 500 mg of Tylenol combined with 600 mg of ibuprofen. This is one maximum strength Tylenol and three regular ibuprofen. You can take these 2-3 times for day for your pain. Please try to take these medications with a small amount of food as well to prevent upsetting your stomach.  Also, please consider topical pain relieving creams such as Voltaran Gel, BioFreeze, or Icy Hot. There is also a pain relieving cream made by [employer]. You should be able to find all of these at your local pharmacy.   Please continue to ice and elevate the left foot as needed.  This will help with inflammation as well as pain in the region.  Below is the contact information for a local orthopedic doctor.  Please give them a call and schedule a follow-up appointment for reevaluation.  If you develop any new or worsening symptoms please come back to the emergency department.  It was a pleasure to meet you.

## 2020-10-27 NOTE — ED Provider Notes (Signed)
[facility] EMERGENCY DEPARTMENT Provider Note   CSN: [id number] Arrival date & time: 10/27/20  1753     History Chief Complaint  Patient presents with   Ankle Injury    Patrick Buck is a 30 y.o. male.  HPI Patient is a 30 year old male with a history of Wolff-Parkinson-White syndrome who presents to the emergency department due to left ankle and foot pain that began yesterday.  Patient states that he accidentally stuck his left foot in a hole and inverted the ankle resulting in his pain.  Reports associated swelling.  No numbness or weakness.    Past Medical History:  Diagnosis Date   Palpitations    Wolff-Parkinson-White (WPW) syndrome 07/13/2010    Patient Active Problem List   Diagnosis Date Noted   Wolff-Parkinson-White (WPW) syndrome 07/13/2010    Past Surgical History:  Procedure Laterality Date   ABLATION     heart ablation 2011   APPENDECTOMY  sept 13 2007   I & D EXTREMITY Left 10/02/2015   Procedure: IRRIGATION AND DEBRIDEMENT FOREARM LACERATIONS;  Surgeon: Mack Hook, MD;  Location: [facility] OR;  Service: Orthopedics;  Laterality: Left;  IRRIGATION AND DEBRIDEMENT FOREARM LACERATIONS       Family History  Problem Relation Age of Onset   Diabetes Mother     Social History   Substance Use Topics   Alcohol use: Yes    Comment: socially   Drug use: Yes    Frequency: 7.0 times per week    Types: Marijuana    Home Medications Prior to Admission medications   Medication Sig Start Date End Date Taking? Authorizing Provider  cephALEXin (KEFLEX) 500 MG capsule Take 1 capsule (500 mg total) by mouth 4 (four) times daily. Patient not taking: Reported on 10/22/2015 10/03/15   Mack Hook, MD  doxycycline (VIBRAMYCIN) 100 MG capsule Take 1 capsule (100 mg total) by mouth 2 (two) times daily. Patient not taking: Reported on 10/22/2015 09/22/15   Molpus, Jonny Ruiz, MD  HYDROcodone-acetaminophen (NORCO) 5-325 MG tablet Take 1 tablet by mouth every 6 (six) hours  as needed for severe pain. 10/11/15   Charm Rings, MD  ibuprofen (ADVIL,MOTRIN) 600 MG tablet Take 1 tablet (600 mg total) by mouth every 6 (six) hours as needed for moderate pain. 10/11/15   Charm Rings, MD  oxyCODONE-acetaminophen (ROXICET) 5-325 MG tablet Take 1-2 tablets by mouth every 4 (four) hours as needed. 10/03/15   Mack Hook, MD    Allergies    Patient has no known allergies.  Review of Systems   Review of Systems  Musculoskeletal:  Positive for arthralgias and joint swelling.  Skin:  Negative for wound.  Neurological:  Negative for weakness and numbness.   Physical Exam Updated Vital Signs BP 123/86   Pulse 60   Temp 98.2 F (36.8 C) (Oral)   Resp 16   Ht 6' (1.829 m)   Wt 81.6 kg   SpO2 98%   BMI 24.41 kg/m   Physical Exam Vitals and nursing note reviewed.  Constitutional:      General: He is not in acute distress.    Appearance: He is well-developed.  HENT:     Head: Normocephalic and atraumatic.     Right Ear: External ear normal.     Left Ear: External ear normal.  Eyes:     General: No scleral icterus.       Right eye: No discharge.        Left eye: No discharge.  Conjunctiva/sclera: Conjunctivae normal.  Neck:     Trachea: No tracheal deviation.  Cardiovascular:     Rate and Rhythm: Normal rate.  Pulmonary:     Effort: Pulmonary effort is normal. No respiratory distress.     Breath sounds: No stridor.  Abdominal:     General: There is no distension.  Musculoskeletal:        General: Swelling and tenderness present. No deformity.     Cervical back: Neck supple.     Comments: Moderate tenderness noted along the lateral malleolus of the left ankle as well as the base of the left fifth metatarsal.  Mild to moderate swelling noted diffusely along the ankle and dorsum of the foot.  Able to wiggle the toes without difficulty.  2+ pedal pulses.  Good cap refill.  Distal sensation intact.  Unable to assess range of motion due to patient's pain.   Skin:    General: Skin is warm and dry.     Findings: No rash.  Neurological:     Mental Status: He is alert.     Cranial Nerves: Cranial nerve deficit: no gross deficits.    ED Results / Procedures / Treatments   Labs (all labs ordered are listed, but only abnormal results are displayed) Labs Reviewed - No data to display  EKG None  Radiology DG Ankle Complete Left  Result Date: 10/27/2020 CLINICAL DATA:  Twisting injury to ankle EXAM: LEFT ANKLE COMPLETE - 3+ VIEW COMPARISON:  None. FINDINGS: Avulsion fracture fragment of the base of the left fifth metatarsal. No other evidence of ankle fracture. There is no evidence of arthropathy or other focal bone abnormality. Extensive soft tissue edema about the lateral ankle. IMPRESSION: 1. Avulsion fracture fragment of the base of the left fifth metatarsal. 2.  No other evidence of ankle fracture. 3.  Extensive soft tissue edema about the lateral ankle. Electronically Signed   By: Lauralyn Primes M.D.   On: 10/27/2020 18:46    Procedures Procedures   Medications Ordered in ED Medications - No data to display  ED Course  I have reviewed the triage vital signs and the nursing notes.  Pertinent labs & imaging results that were available during my care of the patient were reviewed by me and considered in my medical decision making (see chart for details).    MDM Rules/Calculators/A&P                          Patient is a 30 year old male who presents to the emergency department after inverting his left ankle yesterday.  X-rays were obtained in triage showing an avulsion fracture fragment at the base of the left fifth metatarsal.  No other evidence of ankle fracture.  Extensive soft tissue edema about the lateral ankle.  Patient with pain and swelling along the lateral malleolus and base of the left fifth metatarsal.  Consistent with patient's imaging.  Patient is neurovascularly intact in the left foot distal to the injury.  Will place  in a cam boot and give crutches.  Discussed the RICE method.  Tylenol and ibuprofen for management of his pain.  We discussed dosing.  We will give a referral to orthopedics for follow-up.  Patient understands to come back to the emergency department with new or worsening symptoms.  His questions were answered and he was amicable at the time of discharge.  Final Clinical Impression(s) / ED Diagnoses Final diagnoses:  Avulsion fracture of bone  Rx / DC Orders ED Discharge Orders     None        Placido Sou, Cordelia Poche 10/27/20 1915    Vanetta Mulders, MD 10/30/20 1259

## 2020-10-27 NOTE — ED Triage Notes (Signed)
C/o left ankle injury yesterday

## 2020-11-09 ENCOUNTER — Encounter (HOSPITAL_BASED_OUTPATIENT_CLINIC_OR_DEPARTMENT_OTHER): Payer: Self-pay

## 2020-11-09 ENCOUNTER — Other Ambulatory Visit: Payer: Self-pay

## 2020-11-09 ENCOUNTER — Emergency Department (HOSPITAL_BASED_OUTPATIENT_CLINIC_OR_DEPARTMENT_OTHER)
Admission: EM | Admit: 2020-11-09 | Discharge: 2020-11-09 | Disposition: A | Discharge status: Home / Self Care | Payer: Self-pay | Attending: Emergency Medicine | Admitting: Emergency Medicine

## 2020-11-09 DIAGNOSIS — F17210 Nicotine dependence, cigarettes, uncomplicated: Secondary | ICD-10-CM | POA: Insufficient documentation

## 2020-11-09 DIAGNOSIS — Z0000 Encounter for general adult medical examination without abnormal findings: Secondary | ICD-10-CM | POA: Insufficient documentation

## 2020-11-09 NOTE — Discharge Instructions (Addendum)
He may return to work.  Please rest ice and elevate your ankle.

## 2020-11-09 NOTE — ED Provider Notes (Signed)
[facility] EMERGENCY DEPARTMENT Provider Note   CSN: [id number] Arrival date & time: 11/09/20  2045     History Chief Complaint  Patient presents with   Letter for School/Work    Patrick Buck is a 30 y.o. male.  HPI Patient is a 30 year old male presenting today with request for work note to be can return to work.  He has small left ankle avulsion fracture.  He is placed in cam walker and told to follow-up with orthopedics he has not followed up with orthopedics because he cannot afford to.  He states that his symptoms are completely resolved he is walking well without walking boot.  He has no complaints only to return work.    Past Medical History:  Diagnosis Date   Palpitations    Wolff-Parkinson-White (WPW) syndrome 07/13/2010    Patient Active Problem List   Diagnosis Date Noted   Wolff-Parkinson-White (WPW) syndrome 07/13/2010    Past Surgical History:  Procedure Laterality Date   ABLATION     heart ablation 2011   APPENDECTOMY  sept 13 2007   I & D EXTREMITY Left 10/02/2015   Procedure: IRRIGATION AND DEBRIDEMENT FOREARM LACERATIONS;  Surgeon: Mack Hook, MD;  Location: [facility] OR;  Service: Orthopedics;  Laterality: Left;  IRRIGATION AND DEBRIDEMENT FOREARM LACERATIONS       Family History  Problem Relation Age of Onset   Diabetes Mother     Social History   Tobacco Use   Smoking status: Every Day    Packs/day: 0.50    Types: Cigarettes   Smokeless tobacco: Never  Vaping Use   Vaping Use: Every day  Substance Use Topics   Alcohol use: Yes    Comment: socially   Drug use: Yes    Frequency: 7.0 times per week    Types: Marijuana    Home Medications Prior to Admission medications   Medication Sig Start Date End Date Taking? Authorizing Provider  cephALEXin (KEFLEX) 500 MG capsule Take 1 capsule (500 mg total) by mouth 4 (four) times daily. Patient not taking: Reported on 10/22/2015 10/03/15   Mack Hook, MD  doxycycline (VIBRAMYCIN) 100  MG capsule Take 1 capsule (100 mg total) by mouth 2 (two) times daily. Patient not taking: Reported on 10/22/2015 09/22/15   Molpus, Jonny Ruiz, MD  HYDROcodone-acetaminophen (NORCO) 5-325 MG tablet Take 1 tablet by mouth every 6 (six) hours as needed for severe pain. 10/11/15   Charm Rings, MD  ibuprofen (ADVIL,MOTRIN) 600 MG tablet Take 1 tablet (600 mg total) by mouth every 6 (six) hours as needed for moderate pain. 10/11/15   Charm Rings, MD  oxyCODONE-acetaminophen (ROXICET) 5-325 MG tablet Take 1-2 tablets by mouth every 4 (four) hours as needed. 10/03/15   Mack Hook, MD    Allergies    Patient has no known allergies.  Review of Systems   Review of Systems  Constitutional:  Negative for fever.  HENT:  Negative for congestion.   Respiratory:  Negative for shortness of breath.   Cardiovascular:  Negative for chest pain.  Gastrointestinal:  Negative for abdominal distention.  Neurological:  Negative for dizziness and headaches.   Physical Exam Updated Vital Signs BP 97/82 (BP Location: Left Arm)   Pulse 91   Temp 98.6 F (37 C) (Oral)   Resp 18   Ht 6' (1.829 m)   Wt 81.6 kg   SpO2 99%   BMI 24.41 kg/m   Physical Exam Vitals and nursing note reviewed.  Constitutional:  General: He is not in acute distress.    Appearance: Normal appearance. He is not ill-appearing.  HENT:     Head: Normocephalic and atraumatic.  Eyes:     General: No scleral icterus.       Right eye: No discharge.        Left eye: No discharge.     Conjunctiva/sclera: Conjunctivae normal.  Pulmonary:     Effort: Pulmonary effort is normal.     Breath sounds: No stridor.  Musculoskeletal:     Comments: No tenderness palpation of the left ankle at the medial lateral malleolus or posterior ankle.  Skin:    General: Skin is warm and dry.  Neurological:     Mental Status: He is alert and oriented to person, place, and time. Mental status is at baseline.    ED Results / Procedures / Treatments    Labs (all labs ordered are listed, but only abnormal results are displayed) Labs Reviewed - No data to display  EKG None  Radiology No results found.  Procedures Procedures   Medications Ordered in ED Medications - No data to display  ED Course  I have reviewed the triage vital signs and the nursing notes.  Pertinent labs & imaging results that were available during my care of the patient were reviewed by me and considered in my medical decision making (see chart for details).    MDM Rules/Calculators/A&P                          Pt well appearing.   Recommended ortho FU  DNVI  Final Clinical Impression(s) / ED Diagnoses Final diagnoses:  Well adult exam    Rx / DC Orders ED Discharge Orders     None        Gailen Shelter, [place] 11/09/20 2215    Charlynne Pander, MD 11/13/20 [phone]

## 2020-11-09 NOTE — ED Triage Notes (Signed)
Seen here recently for ankle injury.  Needing a note to return to work.

## 2020-12-15 ENCOUNTER — Other Ambulatory Visit: Payer: Self-pay

## 2020-12-15 DIAGNOSIS — F17210 Nicotine dependence, cigarettes, uncomplicated: Secondary | ICD-10-CM | POA: Insufficient documentation

## 2020-12-15 DIAGNOSIS — M7989 Other specified soft tissue disorders: Secondary | ICD-10-CM | POA: Insufficient documentation

## 2020-12-15 DIAGNOSIS — W278XXA Contact with other nonpowered hand tool, initial encounter: Secondary | ICD-10-CM | POA: Insufficient documentation

## 2020-12-15 NOTE — ED Triage Notes (Signed)
Right hand swelling since hitting it with a hammer yesterday. Pt requesting to not have an xray-requesting note so that he can return to work tomorrow.

## 2020-12-16 ENCOUNTER — Emergency Department (HOSPITAL_BASED_OUTPATIENT_CLINIC_OR_DEPARTMENT_OTHER): Payer: Self-pay

## 2020-12-16 ENCOUNTER — Emergency Department (HOSPITAL_BASED_OUTPATIENT_CLINIC_OR_DEPARTMENT_OTHER)
Admission: EM | Admit: 2020-12-16 | Discharge: 2020-12-16 | Disposition: A | Discharge status: Home / Self Care | Payer: Self-pay | Attending: Emergency Medicine | Admitting: Emergency Medicine

## 2020-12-16 ENCOUNTER — Encounter (HOSPITAL_BASED_OUTPATIENT_CLINIC_OR_DEPARTMENT_OTHER): Payer: Self-pay | Admitting: *Deleted

## 2020-12-16 DIAGNOSIS — S60221A Contusion of right hand, initial encounter: Secondary | ICD-10-CM

## 2020-12-16 NOTE — ED Provider Notes (Signed)
[facility] EMERGENCY DEPARTMENT Provider Note   CSN: [id number] Arrival date & time: 12/15/20  2348     History Chief Complaint  Patient presents with   Hand Pain    Patrick Buck is a 30 y.o. male.  The history is provided by the patient.  Hand Pain This is a new problem. The current episode started yesterday. The problem occurs constantly. The problem has not changed since onset.Pertinent negatives include no chest pain, no abdominal pain, no headaches and no shortness of breath. Nothing aggravates the symptoms. Nothing relieves the symptoms. He has tried nothing for the symptoms. The treatment provided no relief.  Smacked right hand with hammer.  Needs work note to go back .      Past Medical History:  Diagnosis Date   Palpitations    Wolff-Parkinson-White (WPW) syndrome 07/13/2010    Patient Active Problem List   Diagnosis Date Noted   Wolff-Parkinson-White (WPW) syndrome 07/13/2010    Past Surgical History:  Procedure Laterality Date   ABLATION     heart ablation 2011   APPENDECTOMY  sept 13 2007   I & D EXTREMITY Left 10/02/2015   Procedure: IRRIGATION AND DEBRIDEMENT FOREARM LACERATIONS;  Surgeon: Mack Hook, MD;  Location: [facility] OR;  Service: Orthopedics;  Laterality: Left;  IRRIGATION AND DEBRIDEMENT FOREARM LACERATIONS       Family History  Problem Relation Age of Onset   Diabetes Mother     Social History   Tobacco Use   Smoking status: Every Day    Packs/day: 0.50    Types: Cigarettes   Smokeless tobacco: Never  Vaping Use   Vaping Use: Every day  Substance Use Topics   Alcohol use: Yes    Comment: socially   Drug use: Yes    Frequency: 7.0 times per week    Types: Marijuana    Home Medications Prior to Admission medications   Medication Sig Start Date End Date Taking? Authorizing Provider  cephALEXin (KEFLEX) 500 MG capsule Take 1 capsule (500 mg total) by mouth 4 (four) times daily. Patient not taking: Reported on 10/22/2015  10/03/15   Mack Hook, MD  doxycycline (VIBRAMYCIN) 100 MG capsule Take 1 capsule (100 mg total) by mouth 2 (two) times daily. Patient not taking: Reported on 10/22/2015 09/22/15   Molpus, Jonny Ruiz, MD  HYDROcodone-acetaminophen (NORCO) 5-325 MG tablet Take 1 tablet by mouth every 6 (six) hours as needed for severe pain. 10/11/15   Charm Rings, MD  ibuprofen (ADVIL,MOTRIN) 600 MG tablet Take 1 tablet (600 mg total) by mouth every 6 (six) hours as needed for moderate pain. 10/11/15   Charm Rings, MD  oxyCODONE-acetaminophen (ROXICET) 5-325 MG tablet Take 1-2 tablets by mouth every 4 (four) hours as needed. 10/03/15   Mack Hook, MD    Allergies    Patient has no known allergies.  Review of Systems   Review of Systems  Constitutional:  Negative for fever.  HENT:  Negative for facial swelling.   Eyes:  Negative for redness.  Respiratory:  Negative for shortness of breath.   Cardiovascular:  Negative for chest pain.  Gastrointestinal:  Negative for abdominal pain.  Genitourinary:  Negative for difficulty urinating.  Musculoskeletal:  Positive for arthralgias. Negative for neck pain and neck stiffness.  Skin:  Negative for rash.  Neurological:  Negative for headaches.  Psychiatric/Behavioral:  Negative for agitation.   All other systems reviewed and are negative.  Physical Exam Updated Vital Signs BP 110/74 (BP Location: Left  Arm)   Pulse (!) 59   Temp 98.8 F (37.1 C) (Oral)   Resp 16   Ht 6' (1.829 m)   Wt 81.6 kg   SpO2 98%   BMI 24.41 kg/m   Physical Exam Vitals and nursing note reviewed.  Constitutional:      General: He is not in acute distress.    Appearance: Normal appearance.  HENT:     Head: Normocephalic and atraumatic.     Nose: Nose normal.  Eyes:     Conjunctiva/sclera: Conjunctivae normal.     Pupils: Pupils are equal, round, and reactive to light.  Cardiovascular:     Rate and Rhythm: Normal rate and regular rhythm.     Pulses: Normal pulses.      Heart sounds: Normal heart sounds.  Pulmonary:     Effort: Pulmonary effort is normal.     Breath sounds: Normal breath sounds.  Abdominal:     General: Abdomen is flat. Bowel sounds are normal.     Palpations: Abdomen is soft.     Tenderness: There is no abdominal tenderness. There is no guarding.  Musculoskeletal:        General: No tenderness. Normal range of motion.     Cervical back: Normal range of motion and neck supple.  Skin:    General: Skin is warm and dry.     Capillary Refill: Capillary refill takes less than 2 seconds.  Neurological:     General: No focal deficit present.     Mental Status: He is alert and oriented to person, place, and time.     Deep Tendon Reflexes: Reflexes normal.  Psychiatric:        Mood and Affect: Mood normal.        Behavior: Behavior normal.    ED Results / Procedures / Treatments   Labs (all labs ordered are listed, but only abnormal results are displayed) Labs Reviewed - No data to display  EKG None  Radiology DG Hand Complete Right  Result Date: 12/16/2020 CLINICAL DATA:  Pain and swelling since injury yesterday. EXAM: RIGHT HAND - COMPLETE 3+ VIEW COMPARISON:  None. FINDINGS: No evidence of acute fracture or dislocation. No focal bone lesion or bone destruction. Joint spaces are normal. Soft tissue swelling over the dorsum of the hand. No radiopaque soft tissue foreign bodies or soft tissue gas identified. IMPRESSION: Soft tissue swelling.  No acute bony abnormalities. Electronically Signed   By: Burman Nieves M.D.   On: 12/16/2020 00:20    Procedures Procedures   Medications Ordered in ED Medications - No data to display  ED Course  I have reviewed the triage vital signs and the nursing notes.  Pertinent labs & imaging results that were available during my care of the patient were reviewed by me and considered in my medical decision making (see chart for details).  Patient wants note to go back to work, this has been  ordered.    Patrick Buck was evaluated in Emergency Department on 12/16/2020 for the symptoms described in the history of present illness. He was evaluated in the context of the global COVID-19 pandemic, which necessitated consideration that the patient might be at risk for infection with the SARS-CoV-2 virus that causes COVID-19. Institutional protocols and algorithms that pertain to the evaluation of patients at risk for COVID-19 are in a state of rapid change based on information released by regulatory bodies including the CDC and federal and state organizations. These policies and algorithms were  followed during the patient's care in the ED.  Final Clinical Impression(s) / ED Diagnoses Final diagnoses:  None   Return for intractable cough, coughing up blood, fevers > 100.4 unrelieved by medication, shortness of breath, intractable vomiting, chest pain, shortness of breath, weakness, numbness, changes in speech, facial asymmetry, abdominal pain, passing out, Inability to tolerate liquids or food, cough, altered mental status or any concerns. No signs of systemic illness or infection. The patient is nontoxic-appearing on exam and vital signs are within normal limits. I have reviewed the triage vital signs and the nursing notes. Pertinent labs & imaging results that were available during my care of the patient were reviewed by me and considered in my medical decision making (see chart for details). After history, exam, and medical workup I feel the patient has been appropriately medically screened and is safe for discharge home. Pertinent diagnoses were discussed with the patient. Patient was given return precautions.   Rx / DC Orders ED Discharge Orders     None        Afton Lavalle, MD 12/16/20 [phone]

## 2021-01-06 ENCOUNTER — Other Ambulatory Visit: Payer: Self-pay

## 2021-01-06 ENCOUNTER — Emergency Department (HOSPITAL_BASED_OUTPATIENT_CLINIC_OR_DEPARTMENT_OTHER)
Admission: EM | Admit: 2021-01-06 | Discharge: 2021-01-06 | Disposition: A | Discharge status: Home / Self Care | Payer: Self-pay | Attending: Emergency Medicine | Admitting: Emergency Medicine

## 2021-01-06 ENCOUNTER — Encounter (HOSPITAL_BASED_OUTPATIENT_CLINIC_OR_DEPARTMENT_OTHER): Payer: Self-pay | Admitting: *Deleted

## 2021-01-06 DIAGNOSIS — F17210 Nicotine dependence, cigarettes, uncomplicated: Secondary | ICD-10-CM | POA: Insufficient documentation

## 2021-01-06 DIAGNOSIS — R112 Nausea with vomiting, unspecified: Secondary | ICD-10-CM | POA: Insufficient documentation

## 2021-01-06 DIAGNOSIS — R1030 Lower abdominal pain, unspecified: Secondary | ICD-10-CM | POA: Insufficient documentation

## 2021-01-06 DIAGNOSIS — R109 Unspecified abdominal pain: Secondary | ICD-10-CM

## 2021-01-06 LAB — COMPREHENSIVE METABOLIC PANEL
ALT: 15 U/L (ref 0–44)
AST: 16 U/L (ref 15–41)
Albumin: 4.3 g/dL (ref 3.5–5.0)
Alkaline Phosphatase: 71 U/L (ref 38–126)
Anion gap: 6 (ref 5–15)
BUN: 8 mg/dL (ref 6–20)
CO2: 29 mmol/L (ref 22–32)
Calcium: 9.4 mg/dL (ref 8.9–10.3)
Chloride: 101 mmol/L (ref 98–111)
Creatinine, Ser: 0.74 mg/dL (ref 0.61–1.24)
GFR, Estimated: >60 mL/min (ref >60)
Glucose, Bld: 101 mg/dL — ABNORMAL HIGH (ref 70–99)
Potassium: 3.9 mmol/L (ref 3.5–5.1)
Sodium: 136 mmol/L (ref 135–145)
Total Bilirubin: 1.1 mg/dL (ref 0.3–1.2)
Total Protein: 7.9 g/dL (ref 6.5–8.1)

## 2021-01-06 LAB — URINALYSIS, ROUTINE W REFLEX MICROSCOPIC
Bilirubin Urine: NEGATIVE
Glucose, UA: NEGATIVE mg/dL
Hgb urine dipstick: NEGATIVE
Ketones, ur: NEGATIVE mg/dL
Leukocytes,Ua: NEGATIVE
Nitrite: NEGATIVE
Protein, ur: 30 mg/dL — AB
Specific Gravity, Urine: 1.02 (ref 1.005–1.030)
pH: 8.5 — ABNORMAL HIGH (ref 5.0–8.0)

## 2021-01-06 LAB — CBC
HCT: 45.2 % (ref 39.0–52.0)
Hemoglobin: 15.1 g/dL (ref 13.0–17.0)
MCH: 29.4 pg (ref 26.0–34.0)
MCHC: 33.4 g/dL (ref 30.0–36.0)
MCV: 88.1 fL (ref 80.0–100.0)
Platelets: 226 10*3/uL (ref 150–400)
RBC: 5.13 MIL/uL (ref 4.22–5.81)
RDW: 12.3 % (ref 11.5–15.5)
WBC: 8.9 10*3/uL (ref 4.0–10.5)
nRBC: 0 % (ref 0.0–0.2)

## 2021-01-06 LAB — LIPASE, BLOOD: Lipase: 24 U/L (ref 11–51)

## 2021-01-06 LAB — URINALYSIS, MICROSCOPIC (REFLEX)

## 2021-01-06 NOTE — ED Provider Notes (Signed)
[facility] EMERGENCY DEPARTMENT Provider Note   CSN: [id number] Arrival date & time: 01/06/21  1618     History Chief Complaint  Patient presents with   Abdominal Pain    Patrick Buck is a 30 y.o. male.  HPI  Patient is a 30 year old male with a medical history as noted below.  He presents to the emergency department due to lower abdominal pain.  States his symptoms started initially 2 days ago and were mild.  They resolved without intervention.  This morning he had a single episode of nausea/vomiting as well as worsening lower abdominal pain.  States it is along his suprapubic region.  Notes that he had not had a bowel movement for 2 days and after having a bowel movement this morning his symptoms then resolved.  Denies any fevers or chills.     Past Medical History:  Diagnosis Date   Palpitations    Wolff-Parkinson-White (WPW) syndrome 07/13/2010    Patient Active Problem List   Diagnosis Date Noted   Wolff-Parkinson-White (WPW) syndrome 07/13/2010    Past Surgical History:  Procedure Laterality Date   ABLATION     heart ablation 2011   APPENDECTOMY  sept 13 2007   I & D EXTREMITY Left 10/02/2015   Procedure: IRRIGATION AND DEBRIDEMENT FOREARM LACERATIONS;  Surgeon: Mack Hook, MD;  Location: [facility] OR;  Service: Orthopedics;  Laterality: Left;  IRRIGATION AND DEBRIDEMENT FOREARM LACERATIONS       Family History  Problem Relation Age of Onset   Diabetes Mother     Social History   Tobacco Use   Smoking status: Every Day    Packs/day: 0.50    Types: Cigarettes   Smokeless tobacco: Never  Vaping Use   Vaping Use: Every day  Substance Use Topics   Alcohol use: Yes    Comment: socially   Drug use: Yes    Frequency: 7.0 times per week    Types: Marijuana    Home Medications Prior to Admission medications   Medication Sig Start Date End Date Taking? Authorizing Provider  cephALEXin (KEFLEX) 500 MG capsule Take 1 capsule (500 mg total) by mouth 4  (four) times daily. Patient not taking: Reported on 10/22/2015 10/03/15   Mack Hook, MD  doxycycline (VIBRAMYCIN) 100 MG capsule Take 1 capsule (100 mg total) by mouth 2 (two) times daily. Patient not taking: Reported on 10/22/2015 09/22/15   Molpus, Jonny Ruiz, MD  HYDROcodone-acetaminophen (NORCO) 5-325 MG tablet Take 1 tablet by mouth every 6 (six) hours as needed for severe pain. 10/11/15   Charm Rings, MD  ibuprofen (ADVIL,MOTRIN) 600 MG tablet Take 1 tablet (600 mg total) by mouth every 6 (six) hours as needed for moderate pain. 10/11/15   Charm Rings, MD  oxyCODONE-acetaminophen (ROXICET) 5-325 MG tablet Take 1-2 tablets by mouth every 4 (four) hours as needed. 10/03/15   Mack Hook, MD    Allergies    Patient has no known allergies.  Review of Systems   Review of Systems  All other systems reviewed and are negative. Ten systems reviewed and are negative for acute change, except as noted in the HPI.   Physical Exam Updated Vital Signs BP 128/85 (BP Location: Right Arm)   Pulse 64   Temp 98.2 F (36.8 C) (Oral)   Resp 18   Ht 6' (1.829 m)   Wt 81.6 kg   SpO2 99%   BMI 24.41 kg/m   Physical Exam Vitals and nursing note reviewed.  Constitutional:  General: He is not in acute distress.    Appearance: Normal appearance. He is not ill-appearing, toxic-appearing or diaphoretic.  HENT:     Head: Normocephalic and atraumatic.     Right Ear: External ear normal.     Left Ear: External ear normal.     Nose: Nose normal.     Mouth/Throat:     Mouth: Mucous membranes are moist.     Pharynx: Oropharynx is clear. No oropharyngeal exudate or posterior oropharyngeal erythema.  Eyes:     Extraocular Movements: Extraocular movements intact.  Cardiovascular:     Rate and Rhythm: Normal rate and regular rhythm.     Pulses: Normal pulses.     Heart sounds: Normal heart sounds. No murmur heard.   No friction rub. No gallop.  Pulmonary:     Effort: Pulmonary effort is normal. No  respiratory distress.     Breath sounds: Normal breath sounds. No stridor. No wheezing, rhonchi or rales.  Abdominal:     General: Abdomen is flat. There is no distension.     Palpations: Abdomen is soft.     Tenderness: There is no abdominal tenderness.  Musculoskeletal:        General: Normal range of motion.     Cervical back: Normal range of motion and neck supple. No tenderness.  Skin:    General: Skin is warm and dry.  Neurological:     General: No focal deficit present.     Mental Status: He is alert and oriented to person, place, and time.  Psychiatric:        Mood and Affect: Mood normal.        Behavior: Behavior normal.   ED Results / Procedures / Treatments   Labs (all labs ordered are listed, but only abnormal results are displayed) Labs Reviewed  COMPREHENSIVE METABOLIC PANEL - Abnormal; Notable for the following components:      Result Value   Glucose, Bld 101 (*)    All other components within normal limits  URINALYSIS, ROUTINE W REFLEX MICROSCOPIC - Abnormal; Notable for the following components:   APPearance HAZY (*)    pH 8.5 (*)    Protein, ur 30 (*)    All other components within normal limits  URINALYSIS, MICROSCOPIC (REFLEX) - Abnormal; Notable for the following components:   Bacteria, UA FEW (*)    All other components within normal limits  LIPASE, BLOOD  CBC    EKG None  Radiology No results found.  Procedures Procedures   Medications Ordered in ED Medications - No data to display  ED Course  I have reviewed the triage vital signs and the nursing notes.  Pertinent labs & imaging results that were available during my care of the patient were reviewed by me and considered in my medical decision making (see chart for details).    MDM Rules/Calculators/A&P                          Pt is a 30 y.o. male who presents to the emergency department due to abdominal pain as well as nausea/vomiting that has since resolved.  Labs: CBC without  abnormalities. Lipase of 24. CMP with a glucose of 101. UA with 30 protein and few bacteria.  I, Placido Sou, PA-C, personally reviewed and evaluated these images and lab results as part of my medical decision-making.  Patient notes that his abdominal pain has been intermittent.  He had been constipated for 2  days and after having a BM this morning his abdominal pain is since resolved.  Currently his abdomen is soft and nontender in all 4 quadrants.  Denies any current nausea/vomiting.  CBC without leukocytosis.  No electrolyte derangements on CMP.  Lipase within normal limits.  UA appears noninfectious.  Did not feel that imaging was warranted at this visit and patient is agreeable.  We discussed return precautions.  Recommended MiraLAX for his constipation.  We discussed dosing.  He was given strict return precautions.  Feel that he is stable for discharge at this time and he is agreeable.  His questions were answered and he was amicable at the time of discharge.  Note: Portions of this report may have been transcribed using voice recognition software. Every effort was made to ensure accuracy; however, inadvertent computerized transcription errors may be present.   Final Clinical Impression(s) / ED Diagnoses Final diagnoses:  Abdominal pain, unspecified abdominal location   Rx / DC Orders ED Discharge Orders     None        Placido Sou, PA-C 01/06/21 2022    Milagros Loll, MD 01/07/21 2355

## 2021-01-06 NOTE — ED Triage Notes (Signed)
C/o mid abd pain x 1 day

## 2021-01-06 NOTE — Discharge Instructions (Signed)
I would recommend trying MiraLAX for your constipation.  You can take 1 scoop per day.  If your symptoms persist you can increase the dose to 2 scoops per day.  Please make sure you are staying adequately hydrated while taking this.  If you develop any new or worsening symptoms please come back to the emergency department.  It was a pleasure to meet you.

## 2021-03-11 ENCOUNTER — Other Ambulatory Visit: Payer: Self-pay

## 2021-03-11 ENCOUNTER — Emergency Department (HOSPITAL_COMMUNITY)
Admission: EM | Admit: 2021-03-11 | Discharge: 2021-03-11 | Disposition: A | Discharge status: Home / Self Care | Payer: Self-pay | Attending: Emergency Medicine | Admitting: Emergency Medicine

## 2021-03-11 ENCOUNTER — Encounter (HOSPITAL_COMMUNITY): Payer: Self-pay

## 2021-03-11 DIAGNOSIS — F1910 Other psychoactive substance abuse, uncomplicated: Secondary | ICD-10-CM | POA: Insufficient documentation

## 2021-03-11 DIAGNOSIS — F17210 Nicotine dependence, cigarettes, uncomplicated: Secondary | ICD-10-CM | POA: Insufficient documentation

## 2021-03-11 DIAGNOSIS — Z20822 Contact with and (suspected) exposure to covid-19: Secondary | ICD-10-CM | POA: Insufficient documentation

## 2021-03-11 DIAGNOSIS — F4321 Adjustment disorder with depressed mood: Secondary | ICD-10-CM | POA: Insufficient documentation

## 2021-03-11 LAB — COMPREHENSIVE METABOLIC PANEL
ALT: 22 U/L (ref 0–44)
AST: 20 U/L (ref 15–41)
Albumin: 4.5 g/dL (ref 3.5–5.0)
Alkaline Phosphatase: 76 U/L (ref 38–126)
Anion gap: 12 (ref 5–15)
BUN: 16 mg/dL (ref 6–20)
CO2: 23 mmol/L (ref 22–32)
Calcium: 9.1 mg/dL (ref 8.9–10.3)
Chloride: 100 mmol/L (ref 98–111)
Creatinine, Ser: 0.88 mg/dL (ref 0.61–1.24)
GFR, Estimated: >60 mL/min (ref >60)
Glucose, Bld: 180 mg/dL — ABNORMAL HIGH (ref 70–99)
Potassium: 3.4 mmol/L — ABNORMAL LOW (ref 3.5–5.1)
Sodium: 135 mmol/L (ref 135–145)
Total Bilirubin: 1.4 mg/dL — ABNORMAL HIGH (ref 0.3–1.2)
Total Protein: 8.4 g/dL — ABNORMAL HIGH (ref 6.5–8.1)

## 2021-03-11 LAB — RESP PANEL BY RT-PCR (FLU A&B, COVID) ARPGX2
Influenza A by PCR: NEGATIVE
Influenza B by PCR: NEGATIVE
SARS Coronavirus 2 by RT PCR: NEGATIVE

## 2021-03-11 LAB — CBC WITH DIFFERENTIAL/PLATELET
Abs Immature Granulocytes: 0.21 10*3/uL — ABNORMAL HIGH (ref 0.00–0.07)
Basophils Absolute: 0.1 10*3/uL (ref 0.0–0.1)
Basophils Relative: 0 %
Eosinophils Absolute: 0 10*3/uL (ref 0.0–0.5)
Eosinophils Relative: 0 %
HCT: 47 % (ref 39.0–52.0)
Hemoglobin: 15.5 g/dL (ref 13.0–17.0)
Immature Granulocytes: 1 %
Lymphocytes Relative: 9 %
Lymphs Abs: 2.6 10*3/uL (ref 0.7–4.0)
MCH: 29.2 pg (ref 26.0–34.0)
MCHC: 33 g/dL (ref 30.0–36.0)
MCV: 88.7 fL (ref 80.0–100.0)
Monocytes Absolute: 1.3 10*3/uL — ABNORMAL HIGH (ref 0.1–1.0)
Monocytes Relative: 5 %
Neutro Abs: 23.6 10*3/uL — ABNORMAL HIGH (ref 1.7–7.7)
Neutrophils Relative %: 85 %
Platelets: 245 10*3/uL (ref 150–400)
RBC: 5.3 MIL/uL (ref 4.22–5.81)
RDW: 13.4 % (ref 11.5–15.5)
WBC: 27.8 10*3/uL — ABNORMAL HIGH (ref 4.0–10.5)
nRBC: 0 % (ref 0.0–0.2)

## 2021-03-11 LAB — SALICYLATE LEVEL: Salicylate Lvl: <7 mg/dL — ABNORMAL LOW (ref 7.0–30.0)

## 2021-03-11 LAB — ETHANOL: Alcohol, Ethyl (B): <10 mg/dL (ref <10)

## 2021-03-11 LAB — ACETAMINOPHEN LEVEL: Acetaminophen (Tylenol), Serum: <10 ug/mL — ABNORMAL LOW (ref 10–30)

## 2021-03-11 MED ORDER — NICOTINE 21 MG/24HR TD PT24: 21.0000 mg | MEDICATED_PATCH | Freq: Every day | TRANSDERMAL | Status: DC

## 2021-03-11 MED ORDER — ACETAMINOPHEN 325 MG PO TABS
650.0000 mg | ORAL_TABLET | Freq: Once | ORAL | Status: AC
  Administered 2021-03-11: 07:00:00 650 mg via ORAL
  Filled 2021-03-11: qty 2

## 2021-03-11 MED ORDER — CALCIUM CARBONATE ANTACID 500 MG PO CHEW
2.0000 | CHEWABLE_TABLET | Freq: Once | ORAL | Status: AC
  Administered 2021-03-11: 06:00:00 400 mg via ORAL
  Filled 2021-03-11: qty 2

## 2021-03-11 MED ORDER — CALCIUM CARBONATE ANTACID 500 MG PO CHEW: 1.0000 | CHEWABLE_TABLET | Freq: Once | ORAL | Status: DC

## 2021-03-11 NOTE — ED Notes (Addendum)
1 bag of pt belongings at nurse desk. Shirt jeans black socks and pink shoes

## 2021-03-11 NOTE — ED Notes (Signed)
Pt has 3 bags (2 pt bags and a red bag) in the triage pt belongings storage. Stickers on all bags.

## 2021-03-11 NOTE — ED Triage Notes (Signed)
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 Pt's brother recently overdosed and the family is worried he will do the same. Last use was earlier yesterday.

## 2021-03-11 NOTE — ED Provider Notes (Signed)
[facility] DEPT Provider Note: Patrick Dell, MD, FACEP  CSN: [id number] MRN: [id number] ARRIVAL: 03/11/21 at 0350 ROOM: [facility]   CHIEF COMPLAINT  IVC    HISTORY OF PRESENT ILLNESS  03/11/21 6:13 AM Patrick Buck is a 30 y.o. male who was brought in under involuntary commitment after his brother filed IVC papers with the magistrate.  The brother's claims in the affidavit and petition are (items enumerated and misspellings corrected by myself):  1.  Threatening he will kill himself if he does not get drugs.  2.  Has a history of overdosing.  3.  Hearing things (voices).  4.  Hostile, punching holes in the walls.  5.  Grieving the loss of his brother.  6.  Harming himself by self-inflicted wounds to hands.  7.  Using prescription drugs oxycodone and fentanyl.  8.  Smokes a lot of weed and cigarettes and drools constantly when he is high.   The patient states his other brother did, indeed, diet about a week and a half ago.  He admits to using cocaine earlier this morning and taking Percocet yesterday to help deal with his grief.  He denies any intent to harm himself.  He states he is not a habitual user of drugs and he has never had a visit to a [facility] for psychiatric issues.  He denies ever taking an overdose of drugs with intent to harm himself.  He denies any auditory or visual hallucinations.  He denies being violent or punching holes in any walls.  He denies harming himself by self-inflicted wounds.  He does acknowledge smoking marijuana and tobacco.  The officers who brought him to the ED tell me he has been polite and cooperative with them for their entire time together.  The domicile where he was picked up was clean and orderly with no evidence of violence or holes in the walls.  He has expressed to them no suicidal thoughts.  They saw no evidence of injury on him.   Past Medical History:  Diagnosis Date   Palpitations    Wolff-Parkinson-White (WPW)  syndrome 07/13/2010    Past Surgical History:  Procedure Laterality Date   ABLATION     heart ablation 2011   APPENDECTOMY  sept 13 2007   I & D EXTREMITY Left 10/02/2015   Procedure: IRRIGATION AND DEBRIDEMENT FOREARM LACERATIONS;  Surgeon: Mack Hook, MD;  Location: [facility] OR;  Service: Orthopedics;  Laterality: Left;  IRRIGATION AND DEBRIDEMENT FOREARM LACERATIONS    Family History  Problem Relation Age of Onset   Diabetes Mother     Social History   Tobacco Use   Smoking status: Every Day    Packs/day: 0.50    Types: Cigarettes   Smokeless tobacco: Never  Vaping Use   Vaping Use: Every day  Substance Use Topics   Alcohol use: Yes    Comment: socially   Drug use: Yes    Frequency: 7.0 times per week    Types: Marijuana    Prior to Admission medications   Medication Sig Start Date End Date Taking? Authorizing Provider  cephALEXin (KEFLEX) 500 MG capsule Take 1 capsule (500 mg total) by mouth 4 (four) times daily. Patient not taking: Reported on 10/22/2015 10/03/15   Mack Hook, MD  doxycycline (VIBRAMYCIN) 100 MG capsule Take 1 capsule (100 mg total) by mouth 2 (two) times daily. Patient not taking: Reported on 10/22/2015 09/22/15   Jovann Luse, MD  HYDROcodone-acetaminophen (NORCO) 5-325 MG tablet Take 1  tablet by mouth every 6 (six) hours as needed for severe pain. 10/11/15   Charm Rings, MD  ibuprofen (ADVIL,MOTRIN) 600 MG tablet Take 1 tablet (600 mg total) by mouth every 6 (six) hours as needed for moderate pain. Patient taking differently: Take by mouth every 6 (six) hours as needed for moderate pain. 10/11/15   Charm Rings, MD  oxyCODONE-acetaminophen (ROXICET) 5-325 MG tablet Take 1-2 tablets by mouth every 4 (four) hours as needed. 10/03/15   Mack Hook, MD    Allergies Patient has no known allergies.   REVIEW OF SYSTEMS  Negative except as noted here or in the History of Present Illness.   PHYSICAL EXAMINATION  Initial Vital Signs Blood pressure  (!) 146/99, pulse (!) 108, temperature 97.8 F (36.6 C), temperature source Oral, resp. rate 16, height 6' (1.829 m), weight 81.6 kg, SpO2 98 %.  Examination General: Well-developed, well-nourished male in no acute distress; appearance consistent with age of record HENT: normocephalic; atraumatic Eyes: Normal appearance Neck: supple Heart: regular rate and rhythm Lungs: clear to auscultation bilaterally Abdomen: soft; nondistended; nontender; bowel sounds present Extremities: No deformity; full range of motion Neurologic: Awake, alert and oriented; motor function intact in all extremities and symmetric; no facial droop Skin: Warm and dry; no injuries of any kind seen on hands or forearms Psychiatric: Normal mood and affect; no SI; no HI   RESULTS  Summary of this visit's results, reviewed and interpreted by myself:   EKG Interpretation  Date/Time:    Ventricular Rate:    PR Interval:    QRS Duration:   QT Interval:    QTC Calculation:   R Axis:     Text Interpretation:         Laboratory Studies: Results for orders placed or performed during the hospital encounter of 03/11/21 (from the past 24 hour(s))  CBC with Differential     Status: Abnormal   Collection Time: 03/11/21  4:03 AM  Result Value Ref Range   WBC 27.8 (H) 4.0 - 10.5 K/uL   RBC 5.30 4.22 - 5.81 MIL/uL   Hemoglobin 15.5 13.0 - 17.0 g/dL   HCT 20.3 55.9 - 74.1 %   MCV 88.7 80.0 - 100.0 fL   MCH 29.2 26.0 - 34.0 pg   MCHC 33.0 30.0 - 36.0 g/dL   RDW 63.8 45.3 - 64.6 %   Platelets 245 150 - 400 K/uL   nRBC 0.0 0.0 - 0.2 %   Neutrophils Relative % 85 %   Neutro Abs 23.6 (H) 1.7 - 7.7 K/uL   Lymphocytes Relative 9 %   Lymphs Abs 2.6 0.7 - 4.0 K/uL   Monocytes Relative 5 %   Monocytes Absolute 1.3 (H) 0.1 - 1.0 K/uL   Eosinophils Relative 0 %   Eosinophils Absolute 0.0 0.0 - 0.5 K/uL   Basophils Relative 0 %   Basophils Absolute 0.1 0.0 - 0.1 K/uL   Immature Granulocytes 1 %   Abs Immature  Granulocytes 0.21 (H) 0.00 - 0.07 K/uL   Reactive, Benign Lymphocytes PRESENT   Comprehensive metabolic panel     Status: Abnormal   Collection Time: 03/11/21  4:03 AM  Result Value Ref Range   Sodium 135 135 - 145 mmol/L   Potassium 3.4 (L) 3.5 - 5.1 mmol/L   Chloride 100 98 - 111 mmol/L   CO2 23 22 - 32 mmol/L   Glucose, Bld 180 (H) 70 - 99 mg/dL   BUN 16 6 - 20 mg/dL  Creatinine, Ser 0.88 0.61 - 1.24 mg/dL   Calcium 9.1 8.9 - 98.1 mg/dL   Total Protein 8.4 (H) 6.5 - 8.1 g/dL   Albumin 4.5 3.5 - 5.0 g/dL   AST 20 15 - 41 U/L   ALT 22 0 - 44 U/L   Alkaline Phosphatase 76 38 - 126 U/L   Total Bilirubin 1.4 (H) 0.3 - 1.2 mg/dL   GFR, Estimated >19 >14 mL/min   Anion gap 12 5 - 15  Ethanol     Status: None   Collection Time: 03/11/21  4:03 AM  Result Value Ref Range   Alcohol, Ethyl (B) <10 <10 mg/dL  Acetaminophen level     Status: Abnormal   Collection Time: 03/11/21  4:03 AM  Result Value Ref Range   Acetaminophen (Tylenol), Serum <10 (L) 10 - 30 ug/mL  Salicylate level     Status: Abnormal   Collection Time: 03/11/21  4:03 AM  Result Value Ref Range   Salicylate Lvl <7.0 (L) 7.0 - 30.0 mg/dL  Resp Panel by RT-PCR (Flu A&B, Covid) Nasopharyngeal Swab     Status: None   Collection Time: 03/11/21  4:06 AM   Specimen: Nasopharyngeal Swab; Nasopharyngeal(NP) swabs in vial transport medium  Result Value Ref Range   SARS Coronavirus 2 by RT PCR NEGATIVE NEGATIVE   Influenza A by PCR NEGATIVE NEGATIVE   Influenza B by PCR NEGATIVE NEGATIVE   Imaging Studies: No results found.  ED COURSE and MDM  Nursing notes, initial and subsequent vitals signs, including pulse oximetry, reviewed and interpreted by myself.  Vitals:   03/11/21 0400 03/11/21 0614  BP: (!) 146/99 (!) 148/101  Pulse: (!) 108 (!) 125  Resp: 16 18  Temp: 97.8 F (36.6 C)   TempSrc: Oral   SpO2: 98% 99%  Weight: 81.6 kg   Height: 6' (1.829 m)    Medications  nicotine (NICODERM CQ - dosed in mg/24  hours) patch 21 mg (has no administration in time range)  acetaminophen (TYLENOL) tablet 650 mg (has no administration in time range)  calcium carbonate (TUMS - dosed in mg elemental calcium) chewable tablet 400 mg of elemental calcium (400 mg of elemental calcium Oral Given 03/11/21 0552)   The patient does admit to substance abuse in the context of grieving the loss of one of his brothers.  He denies any suicidal thought or intent to overdose.  The police report seeing no evidence of the patient having punched holes in the walls.  I see no evidence of any self-harm to his hands or forearms.  I do not believe the patient meets criteria for involuntary commitment and his IVC will be rescinded.  The patient's leukocytosis is likely due to acute drug use.  He is afebrile and has no somatic complaints.   PROCEDURES  Procedures   ED DIAGNOSES     ICD-10-CM   1. Grieving  F43.21     2. Substance abuse (HCC)  F19.10          Gopal Malter, Jonny Ruiz, MD 03/11/21 [phone]

## 2021-03-11 NOTE — ED Notes (Signed)
Pt wanded by security. 

## 2021-03-13 ENCOUNTER — Ambulatory Visit (HOSPITAL_COMMUNITY)
Admission: EM | Admit: 2021-03-13 | Discharge: 2021-03-13 | Disposition: A | Discharge status: Home / Self Care | Payer: No Payment, Other | Attending: Psychiatry | Admitting: Psychiatry

## 2021-03-13 DIAGNOSIS — F1190 Opioid use, unspecified, uncomplicated: Secondary | ICD-10-CM | POA: Insufficient documentation

## 2021-03-13 DIAGNOSIS — F4321 Adjustment disorder with depressed mood: Secondary | ICD-10-CM | POA: Insufficient documentation

## 2021-03-13 DIAGNOSIS — F1910 Other psychoactive substance abuse, uncomplicated: Secondary | ICD-10-CM | POA: Insufficient documentation

## 2021-03-13 DIAGNOSIS — F1290 Cannabis use, unspecified, uncomplicated: Secondary | ICD-10-CM | POA: Insufficient documentation

## 2021-03-13 DIAGNOSIS — F17210 Nicotine dependence, cigarettes, uncomplicated: Secondary | ICD-10-CM | POA: Insufficient documentation

## 2021-03-13 DIAGNOSIS — Z560 Unemployment, unspecified: Secondary | ICD-10-CM | POA: Insufficient documentation

## 2021-03-13 DIAGNOSIS — Z634 Disappearance and death of family member: Secondary | ICD-10-CM | POA: Insufficient documentation

## 2021-03-13 DIAGNOSIS — F1490 Cocaine use, unspecified, uncomplicated: Secondary | ICD-10-CM | POA: Insufficient documentation

## 2021-03-13 NOTE — Discharge Instructions (Addendum)
Here are some other options for residential treatment  [facility]) ph: [phone], ph: [phone] [address], [place], [place]  [facility] ph: [phone], f: [phone] [address] [place], [place] [address]  [facility]: [facility]  [phone] ext. [facility]  [facility] [address], [place], [place] [facility] Program: [phone], [phone]  [facility] [address] [place], [place] [address] [phone]  [facility]  ph: [phone] [address] [place], [place] [address]"  [facility]) [address] [place], [place]  [phone]  [facility] [phone] [address] [place], [place] [address]   Safety:  The patient should abstain from use of illicit substances/drugs and abuse of any medications. If symptoms worsen or do not continue to improve or if the patient becomes actively suicidal or homicidal then it is recommended that the patient return to the closest hospital emergency department, the [facility], or call 911 for further evaluation and treatment. National Suicide Prevention Lifeline 1-800-SUICIDE or (334) 146-6845.  About 988 988 offers 24/7 access to trained crisis counselors who can help people experiencing mental health-related distress. People can call or text 988 or chat 988lifeline.org for themselves or if they are worried about a loved one who may need crisis support.

## 2021-03-13 NOTE — Progress Notes (Signed)
Patient is a 30 year old male that presents this date requesting assistance with ongoing SA issues. Patient denies any S/I, H/I or AVH. Patient states he is grieving the loss of his brother which occurred two weeks ago resulting in patient using substances on 12/14 that his family was concerned about (See [place] MD note of 12/14) and his brother initiated a IVC on patient on that date which was rescinded. Patient is vague in reference to ongoing SA issues. Patient is requesting assistance with SA issues in the form of therapy. As mentioned above see patient's history from 12/14.

## 2021-03-13 NOTE — ED Notes (Signed)
Discharge instructions provided and Pt stated understanding. Pt alert, orient and ambulatory prior to d/c from facility. Personal belongings returned from the black locker. Safety maintained.  

## 2021-03-13 NOTE — ED Provider Notes (Signed)
Behavioral Health Urgent Care Medical Screening Exam  Patient Name: Patrick Buck MRN: [id number] Date of Evaluation: 03/13/21 Diagnosis:  Final diagnoses:  Grief  Cocaine use  Opiate use  Substance abuse (HCC)    History of Present illness: Patrick Buck is a 30 y.o. male patient presented to [facility] as a walk in unaccompanied with a complaints of seeking substance abuse treatment. Patient has no significant past psychiatric history.  Patrick Buck, 30 y.o., male patient seen face to face by this provider, consulted with Dr. Bronwen Betters and chart reviewed on 03/13/21. On evaluation Patrick Buck is in a sitting position, in no acute distress. He is alert/oriented x 4. He is calm and cooperative. His mood is dysphoric and congruent with affect. He is speaking in a clear tone at moderate volume, and normal pace; with Buck eye contact. His thought process is coherent and relevant. There is no indication that he is currently responding to internal/external stimuli or experiencing delusional thought content. He denies SI/HI/AVH. There is no indication that he is responding to internal or external stimuli.   Patrick Buck reports that his family is recommending he go to a substance abuse facility for 7 or 21 days. He reports that his 81 y.o. brother died from a drug overdose two weeks ago. He states that on Monday he went out with some friends to get high. He states that he's grieving the loss of his brother and used an unknown amount of cocaine and heroin. He states that he was found at home unresponsive. He states that he was not narcan at the time. He states that Monday was his first and only time using heroin. He states that he has not used heroin since Monday. He states that Monday was his second time using cocaine. He states that he used cocaine once before in 2021. He states that he only used cocaine recently that one time on Monday. He denies opiate withdrawal symptoms. He denies drinking alcohol.   He reports  depressive symptoms of sadness for the past two weeks. He states that he is grieving. He denies feeling anxious. He reports a fair appetite. He reports fair sleep.   He denies a past hx of substance abuse. He denies  past substance use residential or detox treatment. He denies a past psychiatric hx. He denies current or past outpatient psychiatry and therapy. He denies taking home medications. He reports a medical hx of a heart ablation surgery at age 33 or 40 y.o.  He resides with this girlfriend. He is unemployed. He states that his mother and 8 siblings are supportive and would like for him to get help for drug use. He denies access to weapons including guns in the home.   Per chart review on 03/17/21: commitment after his brother filed IVC papers with the magistrate. The brother's claims in the affidavit and petition are (items enumerated and misspellings corrected by myself):   1.  Threatening he will kill himself if he does not get drugs.  2.  Has a history of overdosing.  3.  Hearing things (voices).  4.  Hostile, punching holes in the walls.  5.  Grieving the loss of his brother.  6.  Harming himself by self-inflicted wounds to hands.  7.  Using prescription drugs oxycodone and fentanyl.  8.  Smokes a lot of weed and cigarettes and drools constantly when he is high.  IVC was rescinded by EDP.   At this time Patrick Buck is educated and verbalizes understanding  of mental health resources and other crisis services in the community. He is instructed to call 911 and present to the nearest emergency room should he experience any suicidal/homicidal ideation, auditory/visual/hallucinations, or detrimental worsening of his mental health condition. Provider consulted with [name], CSW., for a referral to [facility] and other residential and outpatient substance abuse resources as well as therapy at the [facility],   Psychiatric Specialty Exam  Presentation  General Appearance:Appropriate  for Environment  Eye Contact:Fair  Speech:Clear and Coherent  Speech Volume:Normal  Handedness:No data recorded  Mood and Affect  Mood:Dysphoric  Affect:Congruent   Thought Process  Thought Processes:Coherent; Goal Directed  Descriptions of Associations:Intact  Orientation:Full (Time, Place and Person)  Thought Content:Logical    Hallucinations:None  Ideas of Reference:None  Suicidal Thoughts:No  Homicidal Thoughts:No   Sensorium  Memory:Immediate Fair; Recent Fair; Remote Fair  Judgment:Fair  Insight:Fair   Executive Functions  Concentration:Fair  Attention Span:Fair  Recall:Fair  Fund of Knowledge:Fair  Language:Fair   Psychomotor Activity  Psychomotor Activity:Normal   Assets  Assets:Communication Skills; Desire for Improvement; Housing; Intimacy; Leisure Time; Physical Health; Resilience; Social Support   Sleep  Sleep:Fair  Number of hours: 8  Physical Exam: Physical Exam Constitutional:      Appearance: Normal appearance.  HENT:     Head: Normocephalic and atraumatic.     Nose: Nose normal.  Eyes:     Conjunctiva/sclera: Conjunctivae normal.  Cardiovascular:     Rate and Rhythm: Normal rate.  Pulmonary:     Effort: Pulmonary effort is normal.  Musculoskeletal:        General: Normal range of motion.     Cervical back: Normal range of motion.  Neurological:     Mental Status: He is alert and oriented to person, place, and time.   Review of Systems  Constitutional: Negative.   HENT: Negative.    Eyes: Negative.   Respiratory: Negative.    Cardiovascular: Negative.   Gastrointestinal: Negative.   Genitourinary: Negative.   Musculoskeletal: Negative.   Skin: Negative.   Neurological: Negative.   Endo/Heme/Allergies: Negative.   Psychiatric/Behavioral:  Positive for substance abuse.   Blood pressure 126/83, pulse 92, temperature 98.8 F (37.1 C), resp. rate 18, SpO2 100 %. There is no height or weight on file to  calculate BMI.  Musculoskeletal: Strength & Muscle Tone: within normal limits Gait & Station: normal Patient leans: N/A   [facility] MSE Discharge Disposition for Follow up and Recommendations: Based on my evaluation the patient does not appear to have an emergency medical condition and can be discharged with resources and follow up care in outpatient services for Substance Abuse Intensive Outpatient Program, Individual Therapy, and Group Therapy  Follow up recommendations:   Follow-up Information     Go to  [facility].   Why: screening for residential treatment Contact information: [address] [place] [address] [phone]         Call  [facility].   Why: Please contact if you are interested in any outpatient substance abuse services. Contact information: [address] [place] [address], [place] [address]  Office: [phone]  Fax: [phone]        Call  [facility].   Specialty: Urgent Care Why: schedule an appointment for therapy Contact information: [address] [place] [address] [phone]                  Layla Barter, NP 03/13/2021, 1:02 PM

## 2021-03-13 NOTE — Progress Notes (Signed)
CSW added residential substance use treatment options to pt discharge instructions.   Vilma Meckel. Algis Greenhouse, MSW, LCSW, LCAS 03/13/2021 1:15 PM

## 2021-03-23 ENCOUNTER — Telehealth (HOSPITAL_COMMUNITY): Payer: Self-pay | Admitting: Internal Medicine

## 2021-03-23 NOTE — BH Assessment (Signed)
Care Management - [facility] Follow Up Discharges   Writer attempted to make contact with patient today and was unsuccessful.  Writer left a HIPPA compliant voice message.   Per chart review, patient was provided with substance abuse outpatient resources.  

## 2023-06-01 ENCOUNTER — Other Ambulatory Visit: Payer: Self-pay

## 2023-06-01 ENCOUNTER — Emergency Department (HOSPITAL_COMMUNITY)
Admission: EM | Admit: 2023-06-01 | Discharge: 2023-06-02 | Disposition: A | Discharge status: Home / Self Care | Payer: MEDICAID | Attending: Student | Admitting: Student

## 2023-06-01 ENCOUNTER — Encounter (HOSPITAL_COMMUNITY): Payer: Self-pay

## 2023-06-01 ENCOUNTER — Emergency Department (HOSPITAL_COMMUNITY)
Admission: EM | Admit: 2023-06-01 | Discharge: 2023-06-01 | Disposition: A | Discharge status: Home / Self Care | Payer: MEDICAID | Attending: Emergency Medicine | Admitting: Emergency Medicine

## 2023-06-01 DIAGNOSIS — F17210 Nicotine dependence, cigarettes, uncomplicated: Secondary | ICD-10-CM | POA: Insufficient documentation

## 2023-06-01 DIAGNOSIS — F1420 Cocaine dependence, uncomplicated: Secondary | ICD-10-CM | POA: Insufficient documentation

## 2023-06-01 DIAGNOSIS — F1910 Other psychoactive substance abuse, uncomplicated: Secondary | ICD-10-CM | POA: Insufficient documentation

## 2023-06-01 DIAGNOSIS — F1410 Cocaine abuse, uncomplicated: Secondary | ICD-10-CM | POA: Insufficient documentation

## 2023-06-01 LAB — CBC WITH DIFFERENTIAL/PLATELET
Abs Immature Granulocytes: 0.05 10*3/uL (ref 0.00–0.07)
Abs Immature Granulocytes: 0.04 10*3/uL (ref 0.00–0.07)
Basophils Absolute: 0.1 10*3/uL (ref 0.0–0.1)
Basophils Absolute: 0.1 10*3/uL (ref 0.0–0.1)
Basophils Relative: 1 %
Basophils Relative: 1 %
Eosinophils Absolute: 0 10*3/uL (ref 0.0–0.5)
Eosinophils Absolute: 0 10*3/uL (ref 0.0–0.5)
Eosinophils Relative: 0 %
Eosinophils Relative: 0 %
HCT: 44.2 % (ref 39.0–52.0)
HCT: 42.5 % (ref 39.0–52.0)
Hemoglobin: 15.1 g/dL (ref 13.0–17.0)
Hemoglobin: 14.9 g/dL (ref 13.0–17.0)
Immature Granulocytes: 0 %
Immature Granulocytes: 0 %
Lymphocytes Relative: 13 %
Lymphocytes Relative: 24 %
Lymphs Abs: 1.7 10*3/uL (ref 0.7–4.0)
Lymphs Abs: 3.2 10*3/uL (ref 0.7–4.0)
MCH: 29.5 pg (ref 26.0–34.0)
MCH: 29.9 pg (ref 26.0–34.0)
MCHC: 34.2 g/dL (ref 30.0–36.0)
MCHC: 35.1 g/dL (ref 30.0–36.0)
MCV: 86.5 fL (ref 80.0–100.0)
MCV: 85.3 fL (ref 80.0–100.0)
Monocytes Absolute: 0.6 10*3/uL (ref 0.1–1.0)
Monocytes Absolute: 1 10*3/uL (ref 0.1–1.0)
Monocytes Relative: 5 %
Monocytes Relative: 7 %
Neutro Abs: 10.8 10*3/uL — ABNORMAL HIGH (ref 1.7–7.7)
Neutro Abs: 8.8 10*3/uL — ABNORMAL HIGH (ref 1.7–7.7)
Neutrophils Relative %: 81 %
Neutrophils Relative %: 68 %
Platelets: 318 10*3/uL (ref 150–400)
Platelets: 314 10*3/uL (ref 150–400)
RBC: 5.11 MIL/uL (ref 4.22–5.81)
RBC: 4.98 MIL/uL (ref 4.22–5.81)
RDW: 12.6 % (ref 11.5–15.5)
RDW: 12.7 % (ref 11.5–15.5)
WBC: 13.2 10*3/uL — ABNORMAL HIGH (ref 4.0–10.5)
WBC: 13 10*3/uL — ABNORMAL HIGH (ref 4.0–10.5)
nRBC: 0 % (ref 0.0–0.2)
nRBC: 0 % (ref 0.0–0.2)

## 2023-06-01 LAB — COMPREHENSIVE METABOLIC PANEL
ALT: 33 U/L (ref 0–44)
ALT: 31 U/L (ref 0–44)
AST: 28 U/L (ref 15–41)
AST: 25 U/L (ref 15–41)
Albumin: 4.1 g/dL (ref 3.5–5.0)
Albumin: 4 g/dL (ref 3.5–5.0)
Alkaline Phosphatase: 65 U/L (ref 38–126)
Alkaline Phosphatase: 67 U/L (ref 38–126)
Anion gap: 12 (ref 5–15)
Anion gap: 11 (ref 5–15)
BUN: 7 mg/dL (ref 6–20)
BUN: 8 mg/dL (ref 6–20)
CO2: 21 mmol/L — ABNORMAL LOW (ref 22–32)
CO2: 23 mmol/L (ref 22–32)
Calcium: 9.4 mg/dL (ref 8.9–10.3)
Calcium: 9.1 mg/dL (ref 8.9–10.3)
Chloride: 105 mmol/L (ref 98–111)
Chloride: 105 mmol/L (ref 98–111)
Creatinine, Ser: 0.75 mg/dL (ref 0.61–1.24)
Creatinine, Ser: 0.76 mg/dL (ref 0.61–1.24)
GFR, Estimated: >60 mL/min (ref >60)
GFR, Estimated: >60 mL/min (ref >60)
Glucose, Bld: 134 mg/dL — ABNORMAL HIGH (ref 70–99)
Glucose, Bld: 121 mg/dL — ABNORMAL HIGH (ref 70–99)
Potassium: 4 mmol/L (ref 3.5–5.1)
Potassium: 3.3 mmol/L — ABNORMAL LOW (ref 3.5–5.1)
Sodium: 138 mmol/L (ref 135–145)
Sodium: 139 mmol/L (ref 135–145)
Total Bilirubin: 1.1 mg/dL (ref 0.0–1.2)
Total Bilirubin: 1.2 mg/dL (ref 0.0–1.2)
Total Protein: 7.4 g/dL (ref 6.5–8.1)
Total Protein: 7.4 g/dL (ref 6.5–8.1)

## 2023-06-01 LAB — ETHANOL
Alcohol, Ethyl (B): <10 mg/dL (ref <10)
Alcohol, Ethyl (B): <10 mg/dL (ref <10)

## 2023-06-01 LAB — RAPID URINE DRUG SCREEN, HOSP PERFORMED
Amphetamines: NOT DETECTED
Amphetamines: NOT DETECTED
Barbiturates: NOT DETECTED
Barbiturates: NOT DETECTED
Benzodiazepines: NOT DETECTED
Benzodiazepines: NOT DETECTED
Cocaine: POSITIVE — AB
Cocaine: POSITIVE — AB
Opiates: NOT DETECTED
Opiates: NOT DETECTED
Tetrahydrocannabinol: POSITIVE — AB
Tetrahydrocannabinol: POSITIVE — AB

## 2023-06-01 NOTE — Discharge Instructions (Signed)
 We evaluated you for your cocaine use.  Your urine drug screen was positive for cocaine.  Please continue to abstain from cocaine.  If you remain sober, cocaine should hopefully be out of your system in the next 3 to 4 days.  If you need a repeat urine drug screen, you can also try going to urgent care or the behavioral health urgent care, as they can help with this also.  Behavioral health urgent care can also help with other substance abuse treatment centers.

## 2023-06-01 NOTE — ED Provider Triage Note (Signed)
 Emergency Medicine Provider Triage Evaluation Note  Zahir Eisenhour , a 33 y.o. male  was evaluated in triage.  Pt complains of requesting detox.  He uses cocaine.  Has been using cocaine for approximately 4 years.  Last use yesterday at 2 AM.  Wants to go to a day recovery site in [place] but was told he needs to detox for 3 days first.  Denies physical complaints.  No SI or HI  Review of Systems  Positive: As above Negative: As above  Physical Exam  BP 133/87 (BP Location: Right Arm)   Pulse 94   Temp 98.6 F (37 C)   Resp 16   Ht 6' (1.829 m)   Wt 86.2 kg   SpO2 100%   BMI 25.77 kg/m  Gen:   Awake, no distress   Resp:  Normal effort  MSK:   Moves extremities without difficulty  Other:    Medical Decision Making  Medically screening exam initiated at 1:55 PM.  Appropriate orders placed.  Bladyn Tipps was informed that the remainder of the evaluation will be completed by another provider, this initial triage assessment does not replace that evaluation, and the importance of remaining in the ED until their evaluation is complete.  Workup initiated   Michelle Piper, Cordelia Poche 06/01/23 1356

## 2023-06-01 NOTE — Discharge Planning (Signed)
 RNCM contacted by Ms Patrick Buck of Medicaid [phone]) regarding discharge planning.  Ms Patrick Buck will be contact person to coordinate transfer to detox. RNCM met with pt in lobby to update demographics and provide Me Patrick Buck number.

## 2023-06-01 NOTE — ED Notes (Signed)
 Have patient call Ms. Earlene Plater, Futures trader for Medicaid, at [phone].

## 2023-06-01 NOTE — ED Triage Notes (Signed)
 Pt to ED requesting detox from cocaine, reports last use early Tuesday morning, reports ongoing use of cocaine x 4 years. Reports [facility] in [place]

## 2023-06-01 NOTE — ED Provider Notes (Signed)
 [place] EMERGENCY DEPARTMENT AT [facility] Provider Note  CSN: [id number] Arrival date & time: 06/01/23 1239  Chief Complaint(s) Drug Problem  HPI Patrick Buck is a 33 y.o. male history of WPW presenting to the emergency department with cocaine abuse.  Patient reports that he was told he needs to detox.  Came to the emergency department to see what needed to be done.  Denies any physical complaints.  Reports last use was yesterday.  Denies any fentanyl use.  Desires to be sober.  Denies any physical complaints like nausea or vomiting, fevers or chills, headaches, palpitations, chest pain, abdominal pain, or any other new symptoms.  He has a treatment center in mind however since he recently used cocaine he does not think he can get in   Past Medical History Past Medical History:  Diagnosis Date   Palpitations    Wolff-Parkinson-White (WPW) syndrome 07/13/2010   Patient Active Problem List   Diagnosis Date Noted   Wolff-Parkinson-White (WPW) syndrome 07/13/2010   Home Medication(s) Prior to Admission medications   Medication Sig Start Date End Date Taking? Authorizing Provider  cephALEXin (KEFLEX) 500 MG capsule Take 1 capsule (500 mg total) by mouth 4 (four) times daily. Patient not taking: Reported on 10/22/2015 10/03/15   Mack Hook, MD  doxycycline (VIBRAMYCIN) 100 MG capsule Take 1 capsule (100 mg total) by mouth 2 (two) times daily. Patient not taking: Reported on 10/22/2015 09/22/15   Molpus, Jonny Ruiz, MD  HYDROcodone-acetaminophen (NORCO) 5-325 MG tablet Take 1 tablet by mouth every 6 (six) hours as needed for severe pain. 10/11/15   Charm Rings, MD  ibuprofen (ADVIL,MOTRIN) 600 MG tablet Take 1 tablet (600 mg total) by mouth every 6 (six) hours as needed for moderate pain. Patient taking differently: Take by mouth every 6 (six) hours as needed for moderate pain. 10/11/15   Charm Rings, MD  oxyCODONE-acetaminophen (ROXICET) 5-325 MG tablet Take 1-2 tablets by mouth every  4 (four) hours as needed. 10/03/15   Mack Hook, MD                                                                                                                                    Past Surgical History Past Surgical History:  Procedure Laterality Date   ABLATION     heart ablation 2011   APPENDECTOMY  sept 13 2007   I & D EXTREMITY Left 10/02/2015   Procedure: IRRIGATION AND DEBRIDEMENT FOREARM LACERATIONS;  Surgeon: Mack Hook, MD;  Location: [facility] OR;  Service: Orthopedics;  Laterality: Left;  IRRIGATION AND DEBRIDEMENT FOREARM LACERATIONS   Family History Family History  Problem Relation Age of Onset   Diabetes Mother     Social History Social History   Tobacco Use   Smoking status: Every Day    Current packs/day: 0.50    Types: Cigarettes   Smokeless tobacco: Never  Vaping Use   Vaping status: Every  Day  Substance Use Topics   Alcohol use: Yes    Comment: socially   Drug use: Yes    Frequency: 7.0 times per week    Types: Marijuana   Allergies Patient has no known allergies.  Review of Systems Review of Systems  All other systems reviewed and are negative.   Physical Exam Vital Signs  I have reviewed the triage vital signs BP 133/87 (BP Location: Right Arm)   Pulse 94   Temp 98.6 F (37 C)   Resp 16   Ht 6' (1.829 m)   Wt 86.2 kg   SpO2 100%   BMI 25.77 kg/m  Physical Exam Vitals and nursing note reviewed.  Constitutional:      General: He is not in acute distress.    Appearance: Normal appearance.  HENT:     Head: Normocephalic and atraumatic.     Mouth/Throat:     Mouth: Mucous membranes are moist.  Eyes:     Conjunctiva/sclera: Conjunctivae normal.  Cardiovascular:     Rate and Rhythm: Normal rate.  Pulmonary:     Effort: Pulmonary effort is normal. No respiratory distress.  Abdominal:     General: Abdomen is flat.  Skin:    General: Skin is warm and dry.     Capillary Refill: Capillary refill takes less than 2 seconds.   Neurological:     General: No focal deficit present.     Mental Status: He is alert. Mental status is at baseline.  Psychiatric:        Mood and Affect: Mood normal.        Behavior: Behavior normal.     ED Results and Treatments Labs (all labs ordered are listed, but only abnormal results are displayed) Labs Reviewed  COMPREHENSIVE METABOLIC PANEL - Abnormal; Notable for the following components:      Result Value   CO2 21 (*)    Glucose, Bld 134 (*)    All other components within normal limits  RAPID URINE DRUG SCREEN, HOSP PERFORMED - Abnormal; Notable for the following components:   Cocaine POSITIVE (*)    Tetrahydrocannabinol POSITIVE (*)    All other components within normal limits  CBC WITH DIFFERENTIAL/PLATELET - Abnormal; Notable for the following components:   WBC 13.2 (*)    Neutro Abs 10.8 (*)    All other components within normal limits  ETHANOL                                                                                                                          Radiology No results found.  Pertinent labs & imaging results that were available during my care of the patient were reviewed by me and considered in my medical decision making (see MDM for details).  Medications Ordered in ED Medications - No data to display  Procedures Procedures  (including critical care time)  Medical Decision Making / ED Course   MDM:  33 year old presenting to the emergency department with detox request.  Patient is overall well-appearing, he has no complaints.  His UDS is positive for cocaine.  Discussed with patient that there is not any role for inpatient hospitalization for cocaine detox.  Recommended that he remain sober.  He reports he needs a clean urinalysis to be excepted to his treatment center, advised that he could go to urgent  care behavioral health urgent care as well for repeat urinalysis next week, if he remains abstinent cocaine should be out of his system at that point.  Patient also denies any psychiatric complaints such as HI, SI.  Will discharge patient to home. All questions answered. Patient comfortable with plan of discharge. Return precautions discussed with patient and specified on the after visit summary.       Lab Tests: -I ordered, reviewed, and interpreted labs.   The pertinent results include:   Labs Reviewed  COMPREHENSIVE METABOLIC PANEL - Abnormal; Notable for the following components:      Result Value   CO2 21 (*)    Glucose, Bld 134 (*)    All other components within normal limits  RAPID URINE DRUG SCREEN, HOSP PERFORMED - Abnormal; Notable for the following components:   Cocaine POSITIVE (*)    Tetrahydrocannabinol POSITIVE (*)    All other components within normal limits  CBC WITH DIFFERENTIAL/PLATELET - Abnormal; Notable for the following components:   WBC 13.2 (*)    Neutro Abs 10.8 (*)    All other components within normal limits  ETHANOL     Medicines ordered and prescription drug management: No orders of the defined types were placed in this encounter.   -I have reviewed the patients home medicines and have made adjustments as needed   Social Determinants of Health:  Diagnosis or treatment significantly limited by social determinants of health: polysubstance abuse   Co morbidities that complicate the patient evaluation  Past Medical History:  Diagnosis Date   Palpitations    Wolff-Parkinson-White (WPW) syndrome 07/13/2010      Dispostion: Disposition decision including need for hospitalization was considered, and patient discharged from emergency department.    Final Clinical Impression(s) / ED Diagnoses Final diagnoses:  Cocaine dependence without complication [facility])     This chart was dictated using voice recognition software.  Despite best efforts to  proofread,  errors can occur which can change the documentation meaning.    Lonell Grandchild, MD 06/01/23 [phone]

## 2023-06-01 NOTE — ED Triage Notes (Signed)
 Pt states that he needs detox from Fentanyl. Last use was 2 hours ago, Denies SI/HI

## 2023-06-02 MED ORDER — ONDANSETRON 4 MG PO TBDP: 4.0000 mg | ORAL_TABLET | Freq: Three times a day (TID) | ORAL | 0 refills | Status: DC | PRN

## 2023-06-02 MED ORDER — BUPRENORPHINE HCL-NALOXONE HCL 8-2 MG SL FILM: 8.0000 mg | ORAL_FILM | Freq: Two times a day (BID) | SUBLINGUAL | 0 refills | Status: AC

## 2023-06-02 NOTE — ED Provider Notes (Signed)
 [place] EMERGENCY DEPARTMENT AT [facility] Provider Note  CSN: [id number] Arrival date & time: 06/01/23 1925  Chief Complaint(s) No chief complaint on file.  HPI Patrick Buck is a 33 y.o. male with PMH WPW who presents Emergency Department for detox resources.  Patient was seen earlier today for the same complaint, was under the understanding that because he did not have opioids in his system he would not be able to get detox and thus after discharge from the ER today, went home and used fentanyl then returns to the emergency department with request for detox therapy.  He currently is asymptomatic denying chest pain, shortness of breath, abdominal pain, nausea, vomiting or other systemic symptoms.   Past Medical History Past Medical History:  Diagnosis Date   Palpitations    Wolff-Parkinson-White (WPW) syndrome 07/13/2010   Patient Active Problem List   Diagnosis Date Noted   Wolff-Parkinson-White (WPW) syndrome 07/13/2010   Home Medication(s) Prior to Admission medications   Medication Sig Start Date End Date Taking? Authorizing Provider  Buprenorphine HCl-Naloxone HCl (SUBOXONE) 8-2 MG FILM Place 1 Film under the tongue in the morning and at bedtime for 5 days. 06/02/23 06/07/23 Yes Tiona Ruane, MD  ondansetron (ZOFRAN-ODT) 4 MG disintegrating tablet Take 1 tablet (4 mg total) by mouth every 8 (eight) hours as needed for nausea or vomiting. 06/02/23  Yes Kirti Carl, MD  cephALEXin (KEFLEX) 500 MG capsule Take 1 capsule (500 mg total) by mouth 4 (four) times daily. Patient not taking: Reported on 10/22/2015 10/03/15   Mack Hook, MD  doxycycline (VIBRAMYCIN) 100 MG capsule Take 1 capsule (100 mg total) by mouth 2 (two) times daily. Patient not taking: Reported on 10/22/2015 09/22/15   Molpus, Jonny Ruiz, MD  ibuprofen (ADVIL,MOTRIN) 600 MG tablet Take 1 tablet (600 mg total) by mouth every 6 (six) hours as needed for moderate pain. Patient taking differently: Take by mouth  every 6 (six) hours as needed for moderate pain. 10/11/15   Charm Rings, MD                                                                                                                                    Past Surgical History Past Surgical History:  Procedure Laterality Date   ABLATION     heart ablation 2011   APPENDECTOMY  sept 13 2007   I & D EXTREMITY Left 10/02/2015   Procedure: IRRIGATION AND DEBRIDEMENT FOREARM LACERATIONS;  Surgeon: Mack Hook, MD;  Location: [facility] OR;  Service: Orthopedics;  Laterality: Left;  IRRIGATION AND DEBRIDEMENT FOREARM LACERATIONS   Family History Family History  Problem Relation Age of Onset   Diabetes Mother     Social History Social History   Tobacco Use   Smoking status: Every Day    Current packs/day: 0.50    Types: Cigarettes   Smokeless tobacco: Never  Vaping Use   Vaping status: Every Day  Substance  Use Topics   Alcohol use: Yes    Comment: socially   Drug use: Yes    Frequency: 7.0 times per week    Types: Marijuana   Allergies Patient has no known allergies.  Review of Systems Review of Systems  All other systems reviewed and are negative.   Physical Exam Vital Signs  I have reviewed the triage vital signs BP 122/88   Pulse 72   Temp 97.7 F (36.5 C) (Temporal)   Resp 14   SpO2 98%   Physical Exam Constitutional:      General: He is not in acute distress.    Appearance: Normal appearance.  HENT:     Head: Normocephalic and atraumatic.     Nose: No congestion or rhinorrhea.  Eyes:     General:        Right eye: No discharge.        Left eye: No discharge.     Extraocular Movements: Extraocular movements intact.     Pupils: Pupils are equal, round, and reactive to light.  Cardiovascular:     Rate and Rhythm: Normal rate and regular rhythm.     Heart sounds: No murmur heard. Pulmonary:     Effort: No respiratory distress.     Breath sounds: No wheezing or rales.  Abdominal:     General: There is no  distension.     Tenderness: There is no abdominal tenderness.  Musculoskeletal:        General: Normal range of motion.     Cervical back: Normal range of motion.  Skin:    General: Skin is warm and dry.  Neurological:     General: No focal deficit present.     Mental Status: He is alert.     ED Results and Treatments Labs (all labs ordered are listed, but only abnormal results are displayed) Labs Reviewed  COMPREHENSIVE METABOLIC PANEL - Abnormal; Notable for the following components:      Result Value   Potassium 3.3 (*)    Glucose, Bld 121 (*)    All other components within normal limits  RAPID URINE DRUG SCREEN, HOSP PERFORMED - Abnormal; Notable for the following components:   Cocaine POSITIVE (*)    Tetrahydrocannabinol POSITIVE (*)    All other components within normal limits  CBC WITH DIFFERENTIAL/PLATELET - Abnormal; Notable for the following components:   WBC 13.0 (*)    Neutro Abs 8.8 (*)    All other components within normal limits  ETHANOL                                                                                                                          Radiology No results found.  Pertinent labs & imaging results that were available during my care of the patient were reviewed by me and considered in my medical decision making (see MDM for details).  Medications Ordered in ED Medications - No data to display  Procedures Procedures  (including critical care time)  Medical Decision Making / ED Course   This patient presents to the ED for concern of substance abuse, this involves an extensive number of treatment options, and is a complaint that carries with it a high risk of complications and morbidity.  The differential diagnosis includes polysubstance use, substance withdrawal, need for outpatient resources  MDM: Patient  seen emergency room for evaluation of a request for resources for substance abuse detox.  Physical exam is unremarkable.  We had an extended discussion about available resources in the [place] area and I will provide a short prescription for Suboxone for preventative measures as well as Zofran.  Patient will seek outpatient detox in the morning.  At this time he is not showing any clinical evidence of withdrawal.  Currently does not meet inpatient criteria for admission and was discharged with outpatient follow-up   Additional history obtained:  -External records from outside source obtained and reviewed including: Chart review including previous notes, labs, imaging, consultation notes   Lab Tests: -I ordered, reviewed, and interpreted labs.   The pertinent results include:   Labs Reviewed  COMPREHENSIVE METABOLIC PANEL - Abnormal; Notable for the following components:      Result Value   Potassium 3.3 (*)    Glucose, Bld 121 (*)    All other components within normal limits  RAPID URINE DRUG SCREEN, HOSP PERFORMED - Abnormal; Notable for the following components:   Cocaine POSITIVE (*)    Tetrahydrocannabinol POSITIVE (*)    All other components within normal limits  CBC WITH DIFFERENTIAL/PLATELET - Abnormal; Notable for the following components:   WBC 13.0 (*)    Neutro Abs 8.8 (*)    All other components within normal limits  ETHANOL      EKG   EKG Interpretation Date/Time:  Wednesday June 01 2023 19:50:12 EST Ventricular Rate:  98 PR Interval:  142 QRS Duration:  84 QT Interval:  356 QTC Calculation: 454 R Axis:   90  Text Interpretation: Normal sinus rhythm Right atrial enlargement Rightward axis Borderline ECG Confirmed by Tilden Fossa [phone]) on 06/01/2023 11:52:21 PM          Medicines ordered and prescription drug management: Meds ordered this encounter  Medications   ondansetron (ZOFRAN-ODT) 4 MG disintegrating tablet    Sig: Take 1 tablet (4 mg  total) by mouth every 8 (eight) hours as needed for nausea or vomiting.    Dispense:  20 tablet    Refill:  0   Buprenorphine HCl-Naloxone HCl (SUBOXONE) 8-2 MG FILM    Sig: Place 1 Film under the tongue in the morning and at bedtime for 5 days.    Dispense:  10 Film    Refill:  0    -I have reviewed the patients home medicines and have made adjustments as needed  Critical interventions none   Social Determinants of Health:  Factors impacting patients care include: Active polysubstance use   Reevaluation: After the interventions noted above, I reevaluated the patient and found that they have :stayed the same  Co morbidities that complicate the patient evaluation  Past Medical History:  Diagnosis Date   Palpitations    Wolff-Parkinson-White (WPW) syndrome 07/13/2010      Dispostion: I considered admission for this patient, at this time he does not meet inpatient criteria for admission and will be discharged with outpatient follow-up.     Final Clinical Impression(s) / ED Diagnoses Final diagnoses:  Substance abuse (HCC)     @  Charlyne Petrin, MD 06/02/23 [phone]

## 2023-06-02 NOTE — ED Notes (Signed)
 Pt given a [place] sandwich meal and another coke to go at discharge.

## 2023-07-06 ENCOUNTER — Other Ambulatory Visit (HOSPITAL_COMMUNITY)
Admission: RE | Admit: 2023-07-06 | Discharge: 2023-07-06 | Disposition: A | Discharge status: Home / Self Care | Payer: MEDICAID | Source: Ambulatory Visit | Attending: Physician Assistant | Admitting: Physician Assistant

## 2023-07-06 ENCOUNTER — Ambulatory Visit: Payer: MEDICAID | Admitting: Physician Assistant

## 2023-07-06 ENCOUNTER — Encounter: Payer: Self-pay | Admitting: Physician Assistant

## 2023-07-06 VITALS — BP 118/81 | HR 87 | Ht 72.0 in | Wt 190.0 lb

## 2023-07-06 DIAGNOSIS — Z113 Encounter for screening for infections with a predominantly sexual mode of transmission: Secondary | ICD-10-CM | POA: Insufficient documentation

## 2023-07-06 NOTE — Patient Instructions (Signed)
 VISIT SUMMARY:  You came in today for routine screening for sexually transmitted diseases (STDs) and HIV. You reported no known exposures, lesions, sores, or discharge, and you have no other concerns or symptoms.  YOUR PLAN:  -SEXUALLY TRANSMITTED INFECTION SCREENING: You requested screening for STDs and HIV as a preventive measure, even though you have no symptoms or known exposures. We have ordered a urine test for gonorrhea, chlamydia, and trichomonas, and a blood test for HIV and syphilis. You will receive your test results through MyChart. If any prescriptions are needed, they will be sent to the [facility] on [address].   INSTRUCTIONS:  Please review your test results on MyChart. You can expect an office note with your results by Monday.  How to Have Safe Sex Having safe sex means taking steps before and during sex to reduce your risk of: Getting a sexually transmitted infection (STI). Giving your partner an STI. Unwanted or unplanned pregnancy. How to have safe sex Ways you can have safe sex  Limit your sex partners to only one partner who is only having sex with you. Avoid using alcohol and drugs before having sex. Alcohol and drugs can affect your judgment. Before having sex with a new partner: Talk to your partner about past partners, past STIs, and drug use. Get screened for STIs and discuss the results with your partner. Ask your partner to get screened too. Check your body regularly for sores, blisters, rashes, or unusual discharge. If you notice any of these things, call your health care provider. Avoid sexual contact if you have symptoms of an infection or you're being treated for an STI. While having sex, use a condom. Make sure to: Use a condom every time you have vaginal, oral, or anal sex. Both females and males should wear condoms during oral sex. Do not use a male condom and a male condom at the same time during vaginal sex. Using both types at the same time can cause  condoms to break. Keep condoms in place from the beginning to the end of sexual activity. Use a latex condom, if possible. Latex condoms offer the best protection. Use only water-based lubricants with a condom. Using petroleum-based lubricants or oils will weaken the condom and increase the chance that it will break. Ways your health care provider can help you have safe sex  See your provider for regular screenings, exams, and tests for STIs. Talk with your provider about what kind of birth control is best for you. Get vaccinated against hepatitis B and human papillomavirus (HPV). If you're at risk of getting human immunodeficiency virus (HIV), talk with your provider about taking a medicine to prevent HIV. You're at risk for HIV if you: Are a male who has sex with other males. Are sexually active with more than one partner. Take drugs by injection. Have a sex partner who has HIV. Have unprotected sex. Have sex with someone who has sex with both males and females. Follow these instructions at home: Take your medicines only as told. Call your provider if you think you might be pregnant. Call your provider if have any symptoms of an infection. Where to find more information Centers for Disease Control and Prevention (CDC): [employer].no Office on Women's Health: [employer] This information is not intended to replace advice given to you by your health care provider. Make sure you discuss any questions you have with your health care provider. Document Revised: 08/04/2022 Document Reviewed: 08/04/2022 Elsevier Patient Education  2024 [employer].

## 2023-07-06 NOTE — Progress Notes (Unsigned)
 New Patient Office Visit  Subjective    Patient ID: Patrick Buck, male    DOB: [birth year]  Age: 33 y.o. MRN: [id number]  CC:  Chief Complaint  Patient presents with   Screening for STD     No concerns, annual check   Discussed the use of AI scribe software for clinical note transcription with the patient, who gave verbal consent to proceed.  History of Present Illness   The patient presents for routine sexually transmitted disease (STD) and human immunodeficiency virus (HIV) screening. He denies any known exposures, lesions, sores, or discharge. He has no other concerns or symptoms.   Outpatient Encounter Medications as of 07/06/2023  Medication Sig   cephALEXin (KEFLEX) 500 MG capsule Take 1 capsule (500 mg total) by mouth 4 (four) times daily. (Patient not taking: Reported on 07/06/2023)   doxycycline (VIBRAMYCIN) 100 MG capsule Take 1 capsule (100 mg total) by mouth 2 (two) times daily. (Patient not taking: Reported on 10/22/2015)   ibuprofen (ADVIL,MOTRIN) 600 MG tablet Take 1 tablet (600 mg total) by mouth every 6 (six) hours as needed for moderate pain. (Patient not taking: Reported on 07/06/2023)   ondansetron (ZOFRAN-ODT) 4 MG disintegrating tablet Take 1 tablet (4 mg total) by mouth every 8 (eight) hours as needed for nausea or vomiting. (Patient not taking: Reported on 07/06/2023)   No facility-administered encounter medications on file as of 07/06/2023.    Past Medical History:  Diagnosis Date   Palpitations    Wolff-Parkinson-White (WPW) syndrome 07/13/2010    Past Surgical History:  Procedure Laterality Date   ABLATION     heart ablation 2011   APPENDECTOMY  sept 13 2007   I & D EXTREMITY Left 10/02/2015   Procedure: IRRIGATION AND DEBRIDEMENT FOREARM LACERATIONS;  Surgeon: Mack Hook, MD;  Location: [facility] OR;  Service: Orthopedics;  Laterality: Left;  IRRIGATION AND DEBRIDEMENT FOREARM LACERATIONS    Family History  Problem Relation Age of Onset   Diabetes Mother      Social History   Socioeconomic History   Marital status: Single    Spouse name: Not on file   Number of children: Not on file   Years of education: Not on file   Highest education level: Not on file  Occupational History   Not on file  Tobacco Use   Smoking status: Every Day    Current packs/day: 0.50    Types: Cigarettes   Smokeless tobacco: Never  Vaping Use   Vaping status: Former  Substance and Sexual Activity   Alcohol use: Yes    Comment: socially   Drug use: Yes    Frequency: 7.0 times per week    Types: Marijuana   Sexual activity: Not on file  Other Topics Concern   Not on file  Social History Narrative   ** Merged History Encounter **       Social Drivers of Corporate investment banker Strain: Not on file  Food Insecurity: Not on file  Transportation Needs: Not on file  Physical Activity: Not on file  Stress: Not on file  Social Connections: Not on file  Intimate Partner Violence: Not on file    Review of Systems  Constitutional:  Negative for chills and fever.  HENT: Negative.    Eyes: Negative.   Respiratory:  Negative for shortness of breath.   Cardiovascular:  Negative for chest pain.  Gastrointestinal: Negative.   Genitourinary:  Negative for dysuria and hematuria.  Musculoskeletal: Negative.   Skin:  Negative.   Neurological: Negative.   Endo/Heme/Allergies: Negative.   Psychiatric/Behavioral: Negative.          Objective    BP 118/81 (BP Location: Left Arm, Patient Position: Sitting, Cuff Size: Large)   Pulse 87   Ht 6' (1.829 m)   Wt 190 lb (86.2 kg)   SpO2 98%   BMI 25.77 kg/m   Physical Exam Vitals and nursing note reviewed.  Constitutional:      Appearance: Normal appearance.  HENT:     Head: Normocephalic and atraumatic.     Right Ear: External ear normal.     Left Ear: External ear normal.     Nose: Nose normal.     Mouth/Throat:     Mouth: Mucous membranes are moist.     Pharynx: Oropharynx is clear.  Eyes:      Extraocular Movements: Extraocular movements intact.     Conjunctiva/sclera: Conjunctivae normal.     Pupils: Pupils are equal, round, and reactive to light.  Cardiovascular:     Rate and Rhythm: Normal rate and regular rhythm.     Pulses: Normal pulses.     Heart sounds: Normal heart sounds.  Pulmonary:     Effort: Pulmonary effort is normal.     Breath sounds: Normal breath sounds.  Musculoskeletal:        General: Normal range of motion.     Cervical back: Normal range of motion and neck supple.  Skin:    General: Skin is warm and dry.  Neurological:     General: No focal deficit present.     Mental Status: He is alert.  Psychiatric:        Mood and Affect: Mood normal.        Behavior: Behavior normal.        Thought Content: Thought content normal.        Judgment: Judgment normal.         Assessment & Plan:   Problem List Items Addressed This Visit   None Visit Diagnoses       Screen for sexually transmitted diseases    -  Primary      1. Screen for sexually transmitted diseases (Primary)  Requested STI and HIV screening for preventive purposes without symptoms or known exposures. - Urine cytology ancillary only - HIV antibody (with reflex) - RPR   I have reviewed the patient's medical history (PMH, PSH, Social History, Family History, Medications, and allergies) , and have been updated if relevant. I spent 20 minutes reviewing chart and  face to face time with patient.    No follow-ups on file.   Kasandra Knudsen Mayers, PA-C

## 2023-07-07 LAB — RPR: RPR Ser Ql: NONREACTIVE

## 2023-07-07 LAB — HIV ANTIBODY (ROUTINE TESTING W REFLEX): HIV Screen 4th Generation wRfx: NONREACTIVE

## 2023-07-11 ENCOUNTER — Encounter: Payer: Self-pay | Admitting: Physician Assistant

## 2023-07-12 LAB — URINE CYTOLOGY ANCILLARY ONLY
Chlamydia: NEGATIVE
Comment: NEGATIVE
Comment: NEGATIVE
Comment: NORMAL
Neisseria Gonorrhea: NEGATIVE
Trichomonas: NEGATIVE

## 2023-07-19 IMAGING — CR DG HAND COMPLETE 3+V*R*
3 series · 3 of 3 positions shown · non-contrast
Comparison: None.

CLINICAL DATA: Pain and swelling since injury yesterday.

EXAM:
RIGHT HAND - COMPLETE 3+ VIEW

[x hand pa right]
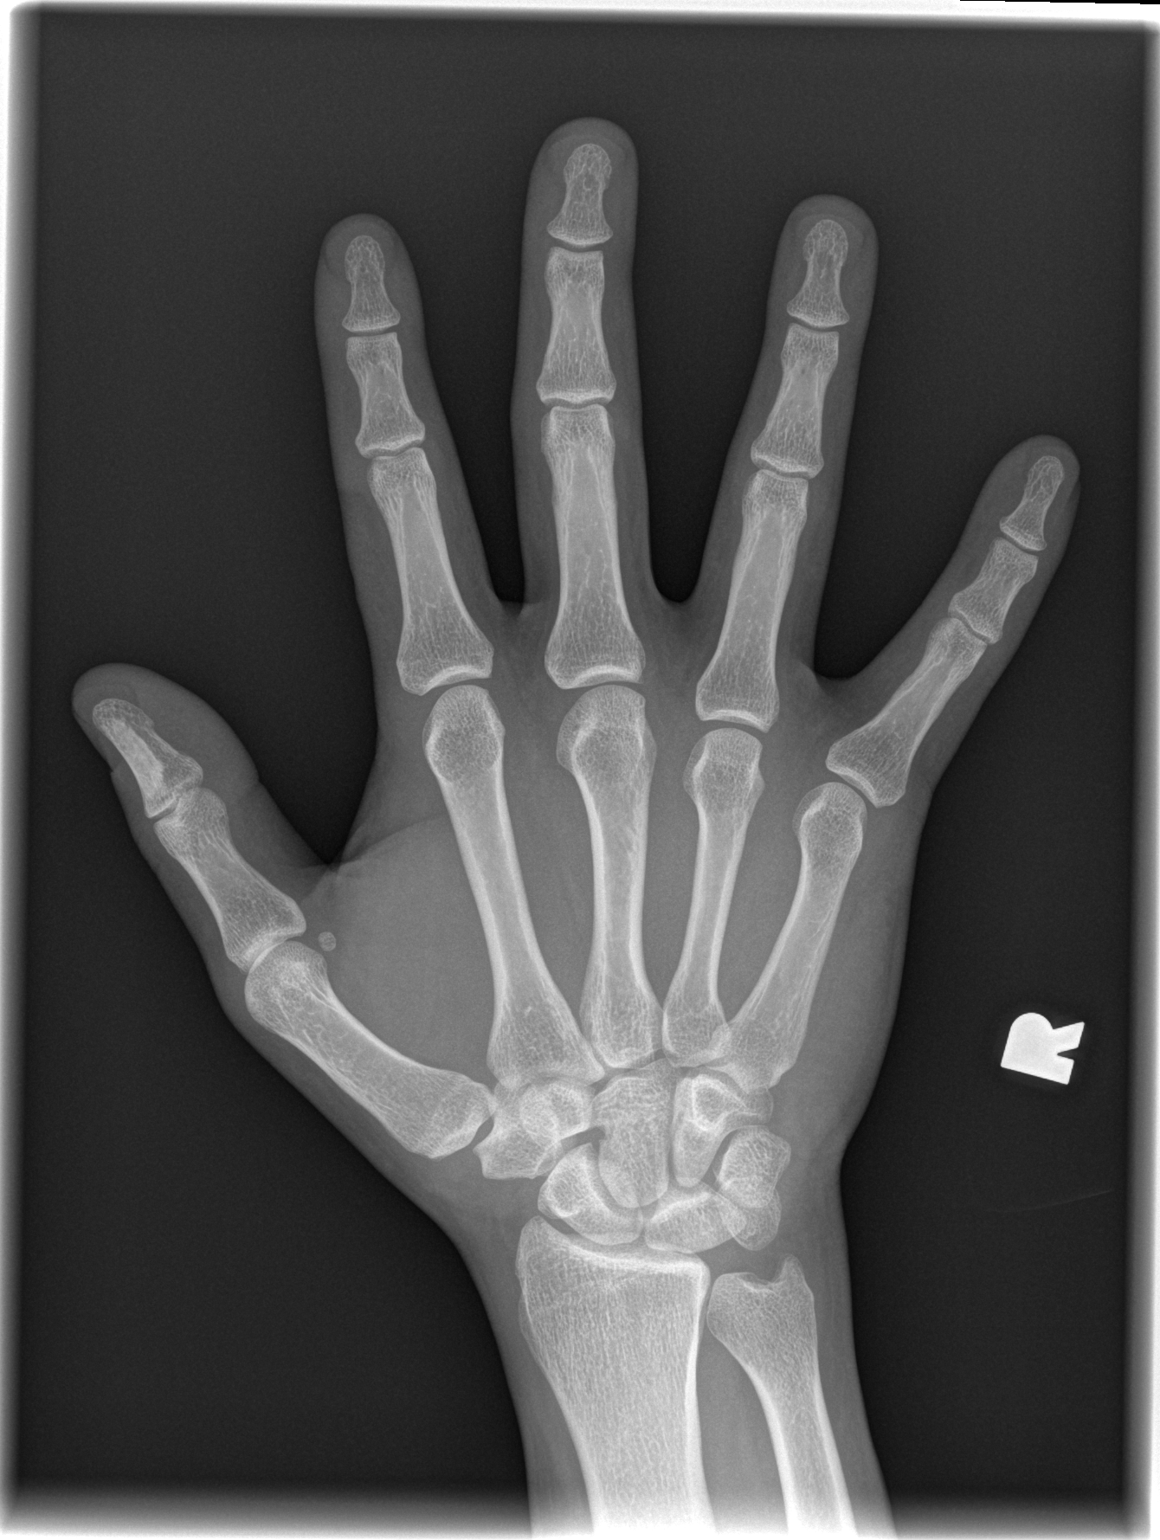

[x hand oblique right]
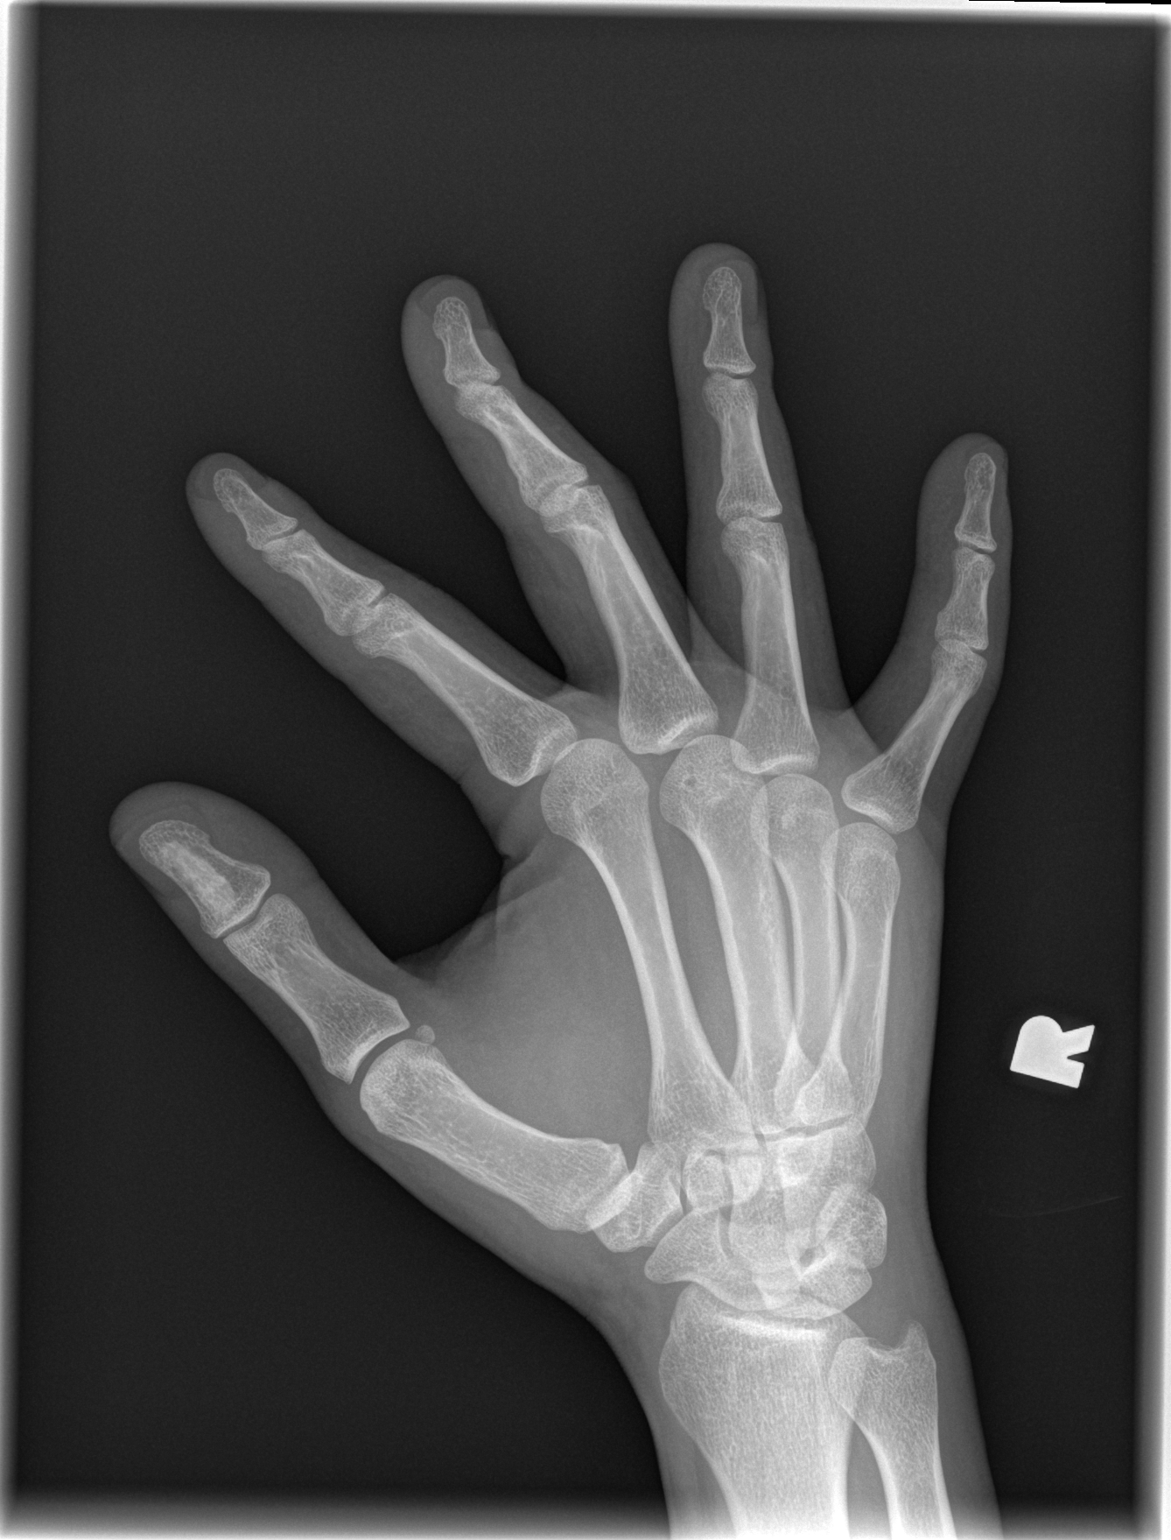

[x hand lat right]
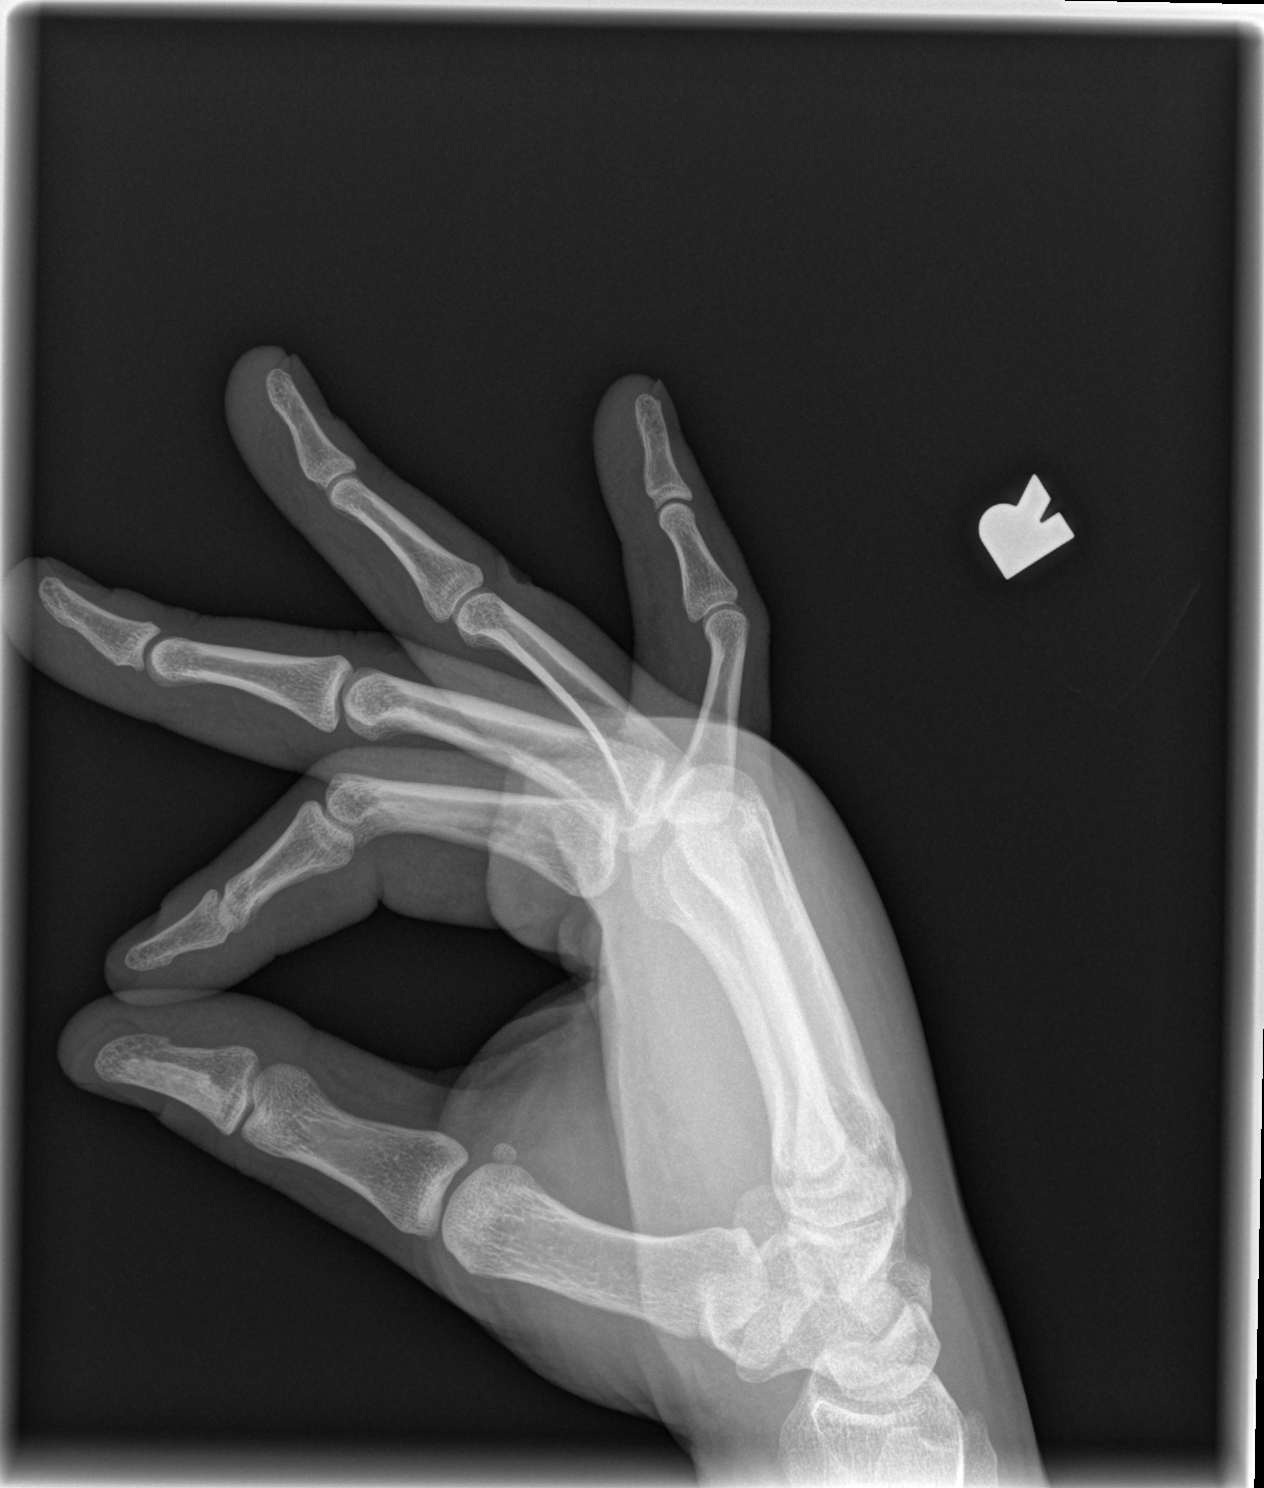

[3 of 3 positions shown; findings below may reference images not displayed]

FINDINGS: No evidence of acute fracture or dislocation. No focal bone lesion
or bone destruction. Joint spaces are normal. Soft tissue swelling
over the dorsum of the hand. No radiopaque soft tissue foreign
bodies or soft tissue gas identified.
IMPRESSION: Soft tissue swelling.  No acute bony abnormalities.

## 2024-02-14 ENCOUNTER — Emergency Department (HOSPITAL_COMMUNITY)
Admission: EM | Admit: 2024-02-14 | Discharge: 2024-02-15 | Disposition: A | Payer: MEDICAID | Attending: Emergency Medicine | Admitting: Emergency Medicine

## 2024-02-14 DIAGNOSIS — S76212A Strain of adductor muscle, fascia and tendon of left thigh, initial encounter: Secondary | ICD-10-CM | POA: Diagnosis not present

## 2024-02-14 DIAGNOSIS — X58XXXA Exposure to other specified factors, initial encounter: Secondary | ICD-10-CM | POA: Diagnosis not present

## 2024-02-14 DIAGNOSIS — J069 Acute upper respiratory infection, unspecified: Secondary | ICD-10-CM | POA: Diagnosis not present

## 2024-02-14 DIAGNOSIS — R059 Cough, unspecified: Secondary | ICD-10-CM | POA: Diagnosis present

## 2024-02-14 NOTE — ED Triage Notes (Signed)
 Patient brought in by POV with c/o cough and congestion along with a headache 3 days. Patient also c/o pain to his groin for 2-3 weeks, denies any swelling.

## 2024-02-15 LAB — RESP PANEL BY RT-PCR (RSV, FLU A&B, COVID)  RVPGX2
Influenza A by PCR: NEGATIVE
Influenza B by PCR: NEGATIVE
Resp Syncytial Virus by PCR: NEGATIVE
SARS Coronavirus 2 by RT PCR: NEGATIVE

## 2024-02-15 MED ORDER — ACETAMINOPHEN 500 MG PO TABS
1000.0000 mg | ORAL_TABLET | Freq: Once | ORAL | Status: AC
  Administered 2024-02-15: 1000 mg via ORAL
  Filled 2024-02-15: qty 2

## 2024-02-15 MED ORDER — KETOROLAC TROMETHAMINE 15 MG/ML IJ SOLN
30.0000 mg | Freq: Once | INTRAMUSCULAR | Status: AC
  Administered 2024-02-15: 30 mg via INTRAMUSCULAR
  Filled 2024-02-15: qty 2

## 2024-02-15 NOTE — ED Provider Notes (Signed)
 [place] EMERGENCY DEPARTMENT AT [facility] Provider Note   CSN: [id number] Arrival date & time: 02/14/24  2345     History Chief Complaint  Patient presents with   Groin Pain   URI    HPI Patrick Buck is a 33 y.o. male presenting for a myriad of complaints.  Endorses fever cough congestion for 3 days, substantial left groin pain for 3 days.  Denies any testicular pain or swelling.  Denies dysuria or other symptoms.  Denies any discharge or new sexual contacts.  Otherwise ambulatory tolerating p.o. intake.   Patient's recorded medical, surgical, social, medication list and allergies were reviewed in the Snapshot window as part of the initial history.   Review of Systems   Review of Systems  Constitutional:  Positive for fever. Negative for chills.  HENT:  Positive for congestion. Negative for ear pain and sore throat.   Eyes:  Negative for pain and visual disturbance.  Respiratory:  Positive for cough. Negative for shortness of breath.   Cardiovascular:  Negative for chest pain and palpitations.  Gastrointestinal:  Negative for abdominal pain and vomiting.  Genitourinary:  Positive for testicular pain. Negative for dysuria and hematuria.  Musculoskeletal:  Negative for arthralgias and back pain.  Skin:  Negative for color change and rash.  Neurological:  Negative for seizures and syncope.  All other systems reviewed and are negative.   Physical Exam Updated Vital Signs BP 135/86 (BP Location: Left Arm)   Pulse 88   Temp 98.7 F (37.1 C) (Oral)   Resp 16   Ht 6' (1.829 m)   Wt 90.7 kg   SpO2 98%   BMI 27.12 kg/m  Physical Exam Vitals and nursing note reviewed.  Constitutional:      General: He is not in acute distress.    Appearance: He is well-developed.  HENT:     Head: Normocephalic and atraumatic.  Eyes:     Conjunctiva/sclera: Conjunctivae normal.  Cardiovascular:     Rate and Rhythm: Normal rate and regular rhythm.  Pulmonary:     Effort:  Pulmonary effort is normal. No respiratory distress.  Abdominal:     General: Abdomen is flat. There is no distension.  Genitourinary:    Comments: Normal cremasteric reflexes, no palpable masses.  No pain on palpation of the groin.  No palpable hernia. Musculoskeletal:        General: No swelling or deformity.  Skin:    General: Skin is warm and dry.     Capillary Refill: Capillary refill takes less than 2 seconds.  Neurological:     Mental Status: He is alert and oriented to person, place, and time. Mental status is at baseline.      ED Course/ Medical Decision Making/ A&P    Procedures Procedures   Medications Ordered in ED Medications  acetaminophen  (TYLENOL ) tablet 1,000 mg (has no administration in time range)  ketorolac  (TORADOL ) 15 MG/ML injection 30 mg (has no administration in time range)   Medical Decision Making:   Patrick Buck is a 33 y.o. male who presented to the ED today with subjective fever, cough, congestion detailed above.    Complete initial physical exam performed, notably the patient  was hemodynamically stable in no acute distress.  Posterior oropharynx illuminated and without obvious swelling or deformity.  Patient is without neck stiffness.    Reviewed and confirmed nursing documentation for past medical history, family history, social history.    Initial Assessment:   With the patient's  presentation of fever cough congestion, most likely diagnosis is developing viral upper respiratory infection. Other diagnoses were considered including (but not limited to) peritonsillar abscess, retropharyngeal abscess, pneumonia. These are considered less likely due to history of present illness and physical exam findings.   This is most consistent with an acute complicated illness Considered meningitis, however patient's symptoms, vital signs, physical exam findings including lack of meningismus seem grossly less consistent at this time. Initial Plan:  Viral screening  including COVID/flu testing to evaluate for common viral etiologies that need to be tracked Will augment with dose of ketorolac  in ED  Objective evaluation as below reviewed   Initial Study Results:   Laboratory  All laboratory results reviewed without evidence of clinically relevant pathology.         Final Assessment and Plan:   On reassessment, patient is ambulatory tolerating p.o. intake in no acute distress.   Patient's COVID test is negative. His groin pain is more consistent with musculoskeletal pathology.  Testicular torsion considered and consistent with this presentation, no evidence of hernia or other acute pathology such epididymitis given lack of testicular focal pain. Will treat conservatively as above. Patient is currently stable for outpatient care and management with no indication for hospitalization or transfer at this time.  Discussed all findings with patient expressed understanding.  Disposition:  Based on the above findings, I believe patient is stable for discharge.    Patient/family educated about specific return precautions for given chief complaint and symptoms.  Patient/family educated about follow-up with PCP.     Patient/family expressed understanding of return precautions and need for follow-up. Patient spoken to regarding all imaging and laboratory results and appropriate follow up for these results. All education provided in verbal form with additional information in written form. Time was allowed for answering of patient questions. Patient discharged.    Emergency Department Medication Summary:   Medications  acetaminophen  (TYLENOL ) tablet 1,000 mg (has no administration in time range)  ketorolac  (TORADOL ) 15 MG/ML injection 30 mg (has no administration in time range)          Clinical Impression:  1. Inguinal strain, left, initial encounter   2. Upper respiratory tract infection, unspecified type      Discharge   Final Clinical Impression(s)  / ED Diagnoses Final diagnoses:  Inguinal strain, left, initial encounter  Upper respiratory tract infection, unspecified type    Rx / DC Orders ED Discharge Orders     None         Jerral Meth, MD 02/15/24 [phone]

## 2024-02-27 ENCOUNTER — Ambulatory Visit (HOSPITAL_COMMUNITY)
Admission: EM | Admit: 2024-02-27 | Discharge: 2024-02-27 | Disposition: A | Discharge status: Home / Self Care | Payer: MEDICAID | Attending: Psychiatry | Admitting: Psychiatry

## 2024-02-27 ENCOUNTER — Other Ambulatory Visit (HOSPITAL_COMMUNITY)
Admission: EM | Admit: 2024-02-27 | Discharge: 2024-02-28 | Disposition: A | Payer: MEDICAID | Attending: Psychiatry | Admitting: Psychiatry

## 2024-02-27 DIAGNOSIS — F1124 Opioid dependence with opioid-induced mood disorder: Secondary | ICD-10-CM | POA: Diagnosis not present

## 2024-02-27 DIAGNOSIS — I456 Pre-excitation syndrome: Secondary | ICD-10-CM | POA: Diagnosis not present

## 2024-02-27 DIAGNOSIS — F1114 Opioid abuse with opioid-induced mood disorder: Secondary | ICD-10-CM

## 2024-02-27 DIAGNOSIS — F419 Anxiety disorder, unspecified: Secondary | ICD-10-CM | POA: Diagnosis not present

## 2024-02-27 DIAGNOSIS — F1420 Cocaine dependence, uncomplicated: Secondary | ICD-10-CM | POA: Diagnosis not present

## 2024-02-27 LAB — COMPREHENSIVE METABOLIC PANEL WITH GFR
ALT: 25 U/L (ref 0–44)
AST: 22 U/L (ref 15–41)
Albumin: 3.4 g/dL — ABNORMAL LOW (ref 3.5–5.0)
Alkaline Phosphatase: 64 U/L (ref 38–126)
Anion gap: 8 (ref 5–15)
BUN: 11 mg/dL (ref 6–20)
CO2: 31 mmol/L (ref 22–32)
Calcium: 8.9 mg/dL (ref 8.9–10.3)
Chloride: 102 mmol/L (ref 98–111)
Creatinine, Ser: 0.79 mg/dL (ref 0.61–1.24)
GFR, Estimated: >60 mL/min (ref >60)
Glucose, Bld: 122 mg/dL — ABNORMAL HIGH (ref 70–99)
Potassium: 4.2 mmol/L (ref 3.5–5.1)
Sodium: 141 mmol/L (ref 135–145)
Total Bilirubin: 1.1 mg/dL (ref 0.0–1.2)
Total Protein: 6.4 g/dL — ABNORMAL LOW (ref 6.5–8.1)

## 2024-02-27 LAB — CBC WITH DIFFERENTIAL/PLATELET
Abs Immature Granulocytes: 0.02 K/uL (ref 0.00–0.07)
Basophils Absolute: 0.1 K/uL (ref 0.0–0.1)
Basophils Relative: 1 %
Eosinophils Absolute: 0.1 K/uL (ref 0.0–0.5)
Eosinophils Relative: 1 %
HCT: 45.1 % (ref 39.0–52.0)
Hemoglobin: 14.9 g/dL (ref 13.0–17.0)
Immature Granulocytes: 0 %
Lymphocytes Relative: 17 %
Lymphs Abs: 1.4 K/uL (ref 0.7–4.0)
MCH: 30.3 pg (ref 26.0–34.0)
MCHC: 33 g/dL (ref 30.0–36.0)
MCV: 91.7 fL (ref 80.0–100.0)
Monocytes Absolute: 0.5 K/uL (ref 0.1–1.0)
Monocytes Relative: 6 %
Neutro Abs: 6 K/uL (ref 1.7–7.7)
Neutrophils Relative %: 75 %
Platelets: 250 K/uL (ref 150–400)
RBC: 4.92 MIL/uL (ref 4.22–5.81)
RDW: 13.1 % (ref 11.5–15.5)
WBC: 8.1 K/uL (ref 4.0–10.5)
nRBC: 0 % (ref 0.0–0.2)

## 2024-02-27 LAB — LIPID PANEL
Cholesterol: 148 mg/dL (ref 0–200)
HDL: 54 mg/dL (ref >40)
LDL Cholesterol: 86 mg/dL (ref 0–99)
Total CHOL/HDL Ratio: 2.7 ratio
Triglycerides: 41 mg/dL (ref <150)
VLDL: 8 mg/dL (ref 0–40)

## 2024-02-27 LAB — POCT URINE DRUG SCREEN - MANUAL ENTRY (I-SCREEN)
POC Amphetamine UR: NOT DETECTED
POC Buprenorphine (BUP): NOT DETECTED
POC Cocaine UR: POSITIVE — AB
POC Marijuana UR: NOT DETECTED
POC Methadone UR: NOT DETECTED
POC Methamphetamine UR: NOT DETECTED
POC Morphine: NOT DETECTED
POC Oxazepam (BZO): NOT DETECTED
POC Oxycodone UR: NOT DETECTED
POC Secobarbital (BAR): NOT DETECTED

## 2024-02-27 LAB — ETHANOL: Alcohol, Ethyl (B): <15 mg/dL (ref <15)

## 2024-02-27 LAB — TSH: TSH: 0.365 u[IU]/mL (ref 0.350–4.500)

## 2024-02-27 LAB — MAGNESIUM: Magnesium: 1.9 mg/dL (ref 1.7–2.4)

## 2024-02-27 MED ORDER — HALOPERIDOL LACTATE 5 MG/ML IJ SOLN: 5.0000 mg | Freq: Three times a day (TID) | INTRAMUSCULAR | Status: DC | PRN

## 2024-02-27 MED ORDER — TRAZODONE HCL 50 MG PO TABS
50.0000 mg | ORAL_TABLET | Freq: Every evening | ORAL | Status: DC | PRN
  Administered 2024-02-27: 50 mg via ORAL
  Filled 2024-02-27: qty 1

## 2024-02-27 MED ORDER — CLONIDINE HCL 0.1 MG PO TABS: 0.1000 mg | ORAL_TABLET | Freq: Every day | ORAL | Status: DC

## 2024-02-27 MED ORDER — NAPROXEN 500 MG PO TABS: 500.0000 mg | ORAL_TABLET | Freq: Two times a day (BID) | ORAL | Status: DC | PRN

## 2024-02-27 MED ORDER — DIPHENHYDRAMINE HCL 50 MG PO CAPS: 50.0000 mg | ORAL_CAPSULE | Freq: Three times a day (TID) | ORAL | Status: DC | PRN

## 2024-02-27 MED ORDER — LOPERAMIDE HCL 2 MG PO CAPS: 2.0000 mg | ORAL_CAPSULE | ORAL | Status: DC | PRN

## 2024-02-27 MED ORDER — DIPHENHYDRAMINE HCL 50 MG/ML IJ SOLN: 50.0000 mg | Freq: Three times a day (TID) | INTRAMUSCULAR | Status: DC | PRN

## 2024-02-27 MED ORDER — LORAZEPAM 2 MG/ML IJ SOLN: 2.0000 mg | Freq: Three times a day (TID) | INTRAMUSCULAR | Status: DC | PRN

## 2024-02-27 MED ORDER — ONDANSETRON 4 MG PO TBDP: 4.0000 mg | ORAL_TABLET | Freq: Four times a day (QID) | ORAL | Status: DC | PRN

## 2024-02-27 MED ORDER — TRAZODONE HCL 50 MG PO TABS: 50.0000 mg | ORAL_TABLET | Freq: Every evening | ORAL | Status: DC | PRN

## 2024-02-27 MED ORDER — NICOTINE 21 MG/24HR TD PT24
21.0000 mg | MEDICATED_PATCH | Freq: Every day | TRANSDERMAL | Status: DC
  Administered 2024-02-28: 21 mg via TRANSDERMAL
  Filled 2024-02-27: qty 1

## 2024-02-27 MED ORDER — CLONIDINE HCL 0.1 MG PO TABS: 0.1000 mg | ORAL_TABLET | Freq: Four times a day (QID) | ORAL | Status: DC

## 2024-02-27 MED ORDER — DICYCLOMINE HCL 20 MG PO TABS: 20.0000 mg | ORAL_TABLET | Freq: Four times a day (QID) | ORAL | Status: DC | PRN

## 2024-02-27 MED ORDER — HALOPERIDOL LACTATE 5 MG/ML IJ SOLN: 10.0000 mg | Freq: Three times a day (TID) | INTRAMUSCULAR | Status: DC | PRN

## 2024-02-27 MED ORDER — ALUM & MAG HYDROXIDE-SIMETH 200-200-20 MG/5ML PO SUSP: 30.0000 mL | ORAL | Status: DC | PRN

## 2024-02-27 MED ORDER — DIPHENHYDRAMINE HCL 50 MG PO CAPS: 50.0000 mg | ORAL_CAPSULE | Freq: Four times a day (QID) | ORAL | Status: DC | PRN

## 2024-02-27 MED ORDER — NICOTINE 21 MG/24HR TD PT24: 21.0000 mg | MEDICATED_PATCH | Freq: Every day | TRANSDERMAL | Status: DC

## 2024-02-27 MED ORDER — HALOPERIDOL 5 MG PO TABS: 5.0000 mg | ORAL_TABLET | Freq: Three times a day (TID) | ORAL | Status: DC | PRN

## 2024-02-27 MED ORDER — HYDROXYZINE HCL 25 MG PO TABS: 25.0000 mg | ORAL_TABLET | Freq: Four times a day (QID) | ORAL | Status: DC | PRN

## 2024-02-27 MED ORDER — METHOCARBAMOL 500 MG PO TABS
500.0000 mg | ORAL_TABLET | Freq: Three times a day (TID) | ORAL | Status: DC | PRN
  Administered 2024-02-27: 500 mg via ORAL
  Filled 2024-02-27: qty 1

## 2024-02-27 MED ORDER — ACETAMINOPHEN 325 MG PO TABS: 650.0000 mg | ORAL_TABLET | Freq: Four times a day (QID) | ORAL | Status: DC | PRN

## 2024-02-27 MED ORDER — HYDROXYZINE HCL 25 MG PO TABS
25.0000 mg | ORAL_TABLET | Freq: Four times a day (QID) | ORAL | Status: DC | PRN
  Administered 2024-02-27 – 2024-02-28 (×3): 25 mg via ORAL
  Filled 2024-02-27 (×3): qty 1

## 2024-02-27 MED ORDER — DICYCLOMINE HCL 20 MG PO TABS
20.0000 mg | ORAL_TABLET | Freq: Four times a day (QID) | ORAL | Status: DC | PRN
  Administered 2024-02-27: 20 mg via ORAL
  Filled 2024-02-27: qty 1

## 2024-02-27 MED ORDER — CLONIDINE HCL 0.1 MG PO TABS: 0.1000 mg | ORAL_TABLET | ORAL | Status: DC

## 2024-02-27 MED ORDER — MAGNESIUM HYDROXIDE 400 MG/5ML PO SUSP: 30.0000 mL | Freq: Every day | ORAL | Status: DC | PRN

## 2024-02-27 MED ORDER — CLONIDINE HCL 0.1 MG PO TABS
0.1000 mg | ORAL_TABLET | Freq: Four times a day (QID) | ORAL | Status: DC
  Administered 2024-02-27 – 2024-02-28 (×5): 0.1 mg via ORAL
  Filled 2024-02-27 (×5): qty 1

## 2024-02-27 MED ORDER — METHOCARBAMOL 500 MG PO TABS: 500.0000 mg | ORAL_TABLET | Freq: Three times a day (TID) | ORAL | Status: DC | PRN

## 2024-02-27 NOTE — Progress Notes (Signed)
   02/27/24 0928  [facility] Triage Screening (Walk-ins at [facility] only)  How Did You Hear About Us ? Self  What Is the Reason for Your Visit/Call Today? Patrick Buck is a 33 year old male presenting to [facility] unaccompanied. Pt states he is looking for detox at this time of triage. Pt is looking to get off of Fentanyl . Pt states that he has been struggling with Fentanyl  daily. Pt reorts he last smoked 3 hours ago roughly less than a gram. Pt states he has been to [facility] before and is looking for a similar process for detox. Pt denies alcohol use in the past 24 hours, Si, Hi and AVH.  How Long Has This Been Causing You Problems? > than 6 months  Have You Recently Had Any Thoughts About Hurting Yourself? No  Are You Planning to Commit Suicide/Harm Yourself At This time? No  Have you Recently Had Thoughts About Hurting Someone Sherral? No  Are You Planning To Harm Someone At This Time? No  Physical Abuse Denies  Verbal Abuse Denies  Sexual Abuse Denies  Exploitation of patient/patient's resources Denies  Self-Neglect Denies  Possible abuse reported to: Other (Comment)  Are you currently experiencing any auditory, visual or other hallucinations? No  Have You Used Any Alcohol or Drugs in the Past 24 Hours? Yes  What Did You Use and How Much? Used today, half a gram  Do you have any current medical co-morbidities that require immediate attention? No  What Do You Feel Would Help You the Most Today? Alcohol or Drug Use Treatment  If access to [facility] was not available, would you have sought care in the Emergency Department? No  Determination of Need Urgent (48 hours)  Options For Referral Facility-Based Crisis  Determination of Need filed? Yes

## 2024-02-27 NOTE — ED Provider Notes (Signed)
 [facility]BC/OBS ASAP Discharge Summary  Date and Time: 02/27/2024 6:48 PM  Name: Patrick Buck  MRN:  [id number]   Discharge Diagnoses:  Final diagnoses:  Opioid dependence with opioid-induced mood disorder (HCC)  Wolff-Parkinson-White (WPW) syndrome    Subjective: Patrick Buck 33 y.o., male patient presented to [facility] as a voluntary walk in unaccompanied with complaints of I'm on fentanyl  and want to detox.  Patient states, "I'm tired of using drugs." He reports learning about the urgent care while at [facility], where he stayed for three weeks before relapsing. He reports smoking fentanyl  for approximately 2-3 years, last used about four hours ago, and typically consumes around 1 gram daily throughout the day. Patient believes the fentanyl  may have been mixed with heroin. He denies use of any other substances. Patient reports smoking less than half a pack of cigarettes daily and denies any mental health history. He receives primary care at [facility] and does not have a psychiatrist or therapist.  Chart review indicates a history of substance abuse, cocaine dependence, and polysubstance abuse. Medical history includes Wolff-Parkinson-White syndrome. Current medication is trazodone for sleep.   Stay Summary: Pt transferred to [facility]   Total Time spent with patient: 30 minutes  Past Psychiatric History: Non-reported.  Past Medical History: Wolff-Parkinson-White syndrome  Family History: Non-reported  Family Psychiatric History: Non-reported  Social History: Lives with his parents, is single, has no children, holds a geneticist, molecular, and works full-time at [employer].  Tobacco Cessation:  Prescription not provided because pt transferred to [facility].  Current Medications:  Current Facility-Administered Medications  Medication Dose Route Frequency Provider Last Rate Last Admin   acetaminophen  (TYLENOL ) tablet 650 mg  650 mg Oral Q6H PRN Isamar Nazir, NP       alum & mag  hydroxide-simeth (MAALOX/MYLANTA) 200-200-20 MG/5ML suspension 30 mL  30 mL Oral Q4H PRN Tighe Gitto, NP       cloNIDine (CATAPRES) tablet 0.1 mg  0.1 mg Oral QID Tashala Cumbo, NP   0.1 mg at 02/27/24 1809   Followed by   NOREEN ON 02/29/2024] cloNIDine (CATAPRES) tablet 0.1 mg  0.1 mg Oral BH-qamhs Mukund Weinreb, NP       Followed by   NOREEN ON 03/03/2024] cloNIDine (CATAPRES) tablet 0.1 mg  0.1 mg Oral QAC breakfast Lyndal Reggio, NP       dicyclomine (BENTYL) tablet 20 mg  20 mg Oral Q6H PRN Samrat Hayward, NP   20 mg at 02/27/24 1809   haloperidol (HALDOL) tablet 5 mg  5 mg Oral TID PRN Kj Imbert, NP       And   diphenhydrAMINE (BENADRYL) capsule 50 mg  50 mg Oral TID PRN Wendelin Bradt, NP       diphenhydrAMINE (BENADRYL) capsule 50 mg  50 mg Oral Q6H PRN Jeneal Vogl, NP       haloperidol lactate (HALDOL) injection 5 mg  5 mg Intramuscular TID PRN Braileigh Landenberger, NP       And   diphenhydrAMINE (BENADRYL) injection 50 mg  50 mg Intramuscular TID PRN Braysen Cloward, NP       And   LORazepam (ATIVAN) injection 2 mg  2 mg Intramuscular TID PRN Ritu Gagliardo, NP       haloperidol lactate (HALDOL) injection 10 mg  10 mg Intramuscular TID PRN Shantia Sanford, NP       And   diphenhydrAMINE (BENADRYL) injection 50 mg  50 mg Intramuscular TID PRN Mahealani Sulak, NP  And   LORazepam (ATIVAN) injection 2 mg  2 mg Intramuscular TID PRN Tyshawn Ciullo, NP       hydrOXYzine (ATARAX) tablet 25 mg  25 mg Oral Q6H PRN Fredrick Dray, NP   25 mg at 02/27/24 1809   loperamide (IMODIUM) capsule 2-4 mg  2-4 mg Oral PRN Maitland Lesiak, NP       magnesium hydroxide (MILK OF MAGNESIA) suspension 30 mL  30 mL Oral Daily PRN Estrellita Lasky, NP       methocarbamol (ROBAXIN) tablet 500 mg  500 mg Oral Q8H PRN Milo Schreier, NP   500 mg  at 02/27/24 1810   naproxen (NAPROSYN) tablet 500 mg  500 mg Oral BID PRN Jakub Debold, NP       [START ON 02/28/2024] nicotine  (NICODERM CQ  - dosed in mg/24 hours) patch 21 mg  21 mg Transdermal Q0600 Emer Onnen, NP       ondansetron  (ZOFRAN -ODT) disintegrating tablet 4 mg  4 mg Oral Q6H PRN Geneive Sandstrom, NP       traZODone (DESYREL) tablet 50 mg  50 mg Oral QHS PRN Jakory Matsuo, NP       Current Outpatient Medications  Medication Sig Dispense Refill   traZODone (DESYREL) 50 MG tablet Take 50 mg by mouth at bedtime.      PTA Medications:  Facility Ordered Medications  Medication   acetaminophen  (TYLENOL ) tablet 650 mg   alum & mag hydroxide-simeth (MAALOX/MYLANTA) 200-200-20 MG/5ML suspension 30 mL   magnesium hydroxide (MILK OF MAGNESIA) suspension 30 mL   haloperidol (HALDOL) tablet 5 mg   And   diphenhydrAMINE (BENADRYL) capsule 50 mg   haloperidol lactate (HALDOL) injection 5 mg   And   diphenhydrAMINE (BENADRYL) injection 50 mg   And   LORazepam (ATIVAN) injection 2 mg   haloperidol lactate (HALDOL) injection 10 mg   And   diphenhydrAMINE (BENADRYL) injection 50 mg   And   LORazepam (ATIVAN) injection 2 mg   diphenhydrAMINE (BENADRYL) capsule 50 mg   traZODone (DESYREL) tablet 50 mg   [START ON 02/28/2024] nicotine  (NICODERM CQ  - dosed in mg/24 hours) patch 21 mg   dicyclomine (BENTYL) tablet 20 mg   hydrOXYzine (ATARAX) tablet 25 mg   loperamide (IMODIUM) capsule 2-4 mg   methocarbamol (ROBAXIN) tablet 500 mg   naproxen (NAPROSYN) tablet 500 mg   ondansetron  (ZOFRAN -ODT) disintegrating tablet 4 mg   cloNIDine (CATAPRES) tablet 0.1 mg   Followed by   NOREEN ON 02/29/2024] cloNIDine (CATAPRES) tablet 0.1 mg   Followed by   NOREEN ON 03/03/2024] cloNIDine (CATAPRES) tablet 0.1 mg       07/06/2023    5:09 PM  Depression screen PHQ 2/9  Decreased Interest 0  Down, Depressed, Hopeless 0  PHQ - 2 Score 0  Altered sleeping 0   Tired, decreased energy 0  Change in appetite 0  Feeling bad or failure about yourself  0  Trouble concentrating 0  Moving slowly or fidgety/restless 0  Suicidal thoughts 0  PHQ-9 Score 0   Difficult doing work/chores Not difficult at all     Data saved with a previous flowsheet row definition    Flowsheet Row ED from 02/27/2024 in [facility] Most recent reading at 02/27/2024 11:32 AM ED from 02/27/2024 in [facility] Most recent reading at 02/27/2024  9:38 AM ED from 06/01/2023 in [facility] Emergency Department at [facility] Most recent reading at 06/01/2023  7:43 PM  C-SSRS RISK CATEGORY No Risk No Risk No Risk    Musculoskeletal  Strength & Muscle Tone: within normal limits Gait & Station: normal Patient leans: N/A  Psychiatric Specialty Exam  Presentation  General Appearance:  Appropriate for Environment  Eye Contact: Fair  Speech: Clear and Coherent; Normal Rate  Speech Volume: Normal  Handedness: Right   Mood and Affect  Mood: Depressed  Affect: Appropriate   Thought Process  Thought Processes: Coherent  Descriptions of Associations:Intact  Orientation:Full (Time, Place and Person)  Thought Content:WDL  Diagnosis of Schizophrenia or Schizoaffective disorder in past: No    Hallucinations:Hallucinations: None  Ideas of Reference:None  Suicidal Thoughts:Suicidal Thoughts: No  Homicidal Thoughts:Homicidal Thoughts: No   Sensorium  Memory: Immediate Fair  Judgment: Fair  Insight: Good   Executive Functions  Concentration: Good  Attention Span: Good  Recall: Fair  Fund of Knowledge: Fair  Language: Fair   Psychomotor Activity  Psychomotor Activity: Psychomotor Activity: Normal   Assets  Assets: Communication Skills; Desire for Improvement; Housing; Vocational/Educational   Sleep  Sleep: Sleep: Good  Estimated Sleeping Duration (Last 24  Hours): 0.00 hours  Nutritional Assessment (For OBS and FBC admissions only) Has the patient had a weight loss or gain of 10 pounds or more in the last 3 months?: No Has the patient had a decrease in food intake/or appetite?: No Does the patient have dental problems?: No Does the patient have eating habits or behaviors that may be indicators of an eating disorder including binging or inducing vomiting?: No Has the patient recently lost weight without trying?: 0 Has the patient been eating poorly because of a decreased appetite?: 0 Malnutrition Screening Tool Score: 0    Physical Exam  Physical Exam Vitals reviewed.  Constitutional:      Appearance: Normal appearance.  HENT:     Head: Normocephalic and atraumatic.     Nose: Nose normal.     Mouth/Throat:     Pharynx: Oropharynx is clear.  Cardiovascular:     Rate and Rhythm: Normal rate.  Pulmonary:     Effort: Pulmonary effort is normal.  Musculoskeletal:        General: Normal range of motion.     Cervical back: Normal range of motion.  Neurological:     Mental Status: He is alert and oriented to person, place, and time.  Psychiatric:        Attention and Perception: Attention and perception normal.        Mood and Affect: Mood is anxious and depressed.        Speech: Speech normal.        Behavior: Behavior normal. Behavior is cooperative.        Thought Content: Thought content normal.        Cognition and Memory: Cognition normal.        Judgment: Judgment normal.    Review of Systems  Psychiatric/Behavioral:  Positive for depression and substance abuse. The patient has insomnia.   All other systems reviewed and are negative.  Blood pressure 120/78, pulse 80, temperature 98.6 F (37 C), resp. rate 18. There is no height or weight on file to calculate BMI.  Demographic Factors:  Male  Loss Factors: NA  Historical Factors: NA  Risk Reduction Factors:   Positive social support  Continued Clinical  Symptoms:  Alcohol/Substance Abuse/Dependencies  Cognitive Features That Contribute To Risk:  None    Suicide Risk:  Minimal: No identifiable suicidal ideation.  Patients presenting with no risk  factors but with morbid ruminations; may be classified as minimal risk based on the severity of the depressive symptoms  Plan Of Care/Follow-up recommendations:  Transfer to [facility] for detox   Disposition: FBC.   Tosin Aayansh Codispoti, NP 02/27/2024, 6:48 PM

## 2024-02-27 NOTE — Care Management (Signed)
 [facility] Care Management.Scientist, Research (medical) met with the Client to discuss discharge planning.  Client reports he needed to detox from fentanyl .  Client reports prior to coming to [facility] he was at [facility].    Client reports he will only consider a treatment facility that allows him to work.  Client reports his primary focus is to return to [facility] off of [address] in [place], [place].    Client requested the Writer call and talk to [facility] [phone].  Vinie reports the beds can be held for 72 hours.  Once he has a discharge date he requested for him to call the Help Center at [phone] so they can assess him and to hold a bed for him.  Writer explored other treatment options with the client but he desires to return back to the [facility].

## 2024-02-27 NOTE — Group Note (Signed)
 Group Topic: Positive Affirmations  Group Date: 02/27/2024 Start Time: 1230 End Time: 1255 Facilitators: Reinhold Minor BROCKS, RN  Department: [facility]  Number of Participants: 5  Group Focus: affirmation Treatment Modality:  Psychoeducation Interventions utilized were patient education Purpose: express feelings  Name: Patrick Buck Date of Birth: February 10, 1991  MR: [id number]    Level of Participation: minimal Quality of Participation: engaged Interactions with others: gave feedback Mood/Affect: positive Triggers (if applicable): na Cognition: logical Progress: Gaining insight Response: speak positively towards self and situations Plan: patient will be encouraged to speak positively  Patients Problems:  Patient Active Problem List   Diagnosis Date Noted   Opioid dependence with opioid-induced mood disorder (HCC) 02/27/2024   Wolff-Parkinson-White (WPW) syndrome 07/13/2010

## 2024-02-27 NOTE — ED Notes (Signed)
 Patient alert & oriented x4. Pt admitted to [facility], oriented to unit and rules.pt reports is here because of drug use.  Pt denies intent to harm self or others. Denies AVH. No signs of acute distress noted. Encouraged patient to notify staff if any thoughts of harm towards self or others arise. Patient verbalizes understanding and agreement. Fluids offered. Skin intact no opean areas. Pt flat affect, sad, minimal interaction.

## 2024-02-27 NOTE — ED Provider Notes (Signed)
 [facility] Continuous Assessment Admission H&P  Date: 02/27/24 Patient Name: Patrick Buck MRN: [id number] Chief Complaint: I'm on fentanyl   Diagnoses:  Final diagnoses:  Wolff-Parkinson-White (WPW) syndrome  Opioid abuse with opioid-induced mood disorder [facility])    HPI: Patrick Buck 33 y.o., male patient presented to [facility] as a voluntary walk in unaccompanied with complaints of I'm on fentanyl  and want to detox.  Patient states, "I'm tired of using drugs." He reports learning about the urgent care while at [facility], where he stayed for three weeks before relapsing. He reports smoking fentanyl  for approximately 2-3 years, last used about four hours ago, and typically consumes around 1 gram daily throughout the day. Patient believes the fentanyl  may have been mixed with heroin. He denies use of any other substances. Patient reports smoking less than half a pack of cigarettes daily and denies any mental health history. He receives primary care at [facility] and does not have a psychiatrist or therapist.  Chart review indicates a history of substance abuse, cocaine dependence, and polysubstance abuse. Medical history includes Wolff-Parkinson-White syndrome. Current medication is trazodone for sleep.   Patrick Buck, is seen face to face by this provider, consulted with Dr. Cole; and chart reviewed on 02/27/24.  On evaluation Patrick Buck denies SI/HI/A/VH.  He denies any history of trauma or abuse. He reports living with his parents and 7 siblings; however, his dad has had 4 wives and fathered 22 children, while his mom has 13 kids. He denies access to weapons or firearms. He denies any upcoming court dates or legal history. He reports completing high school and working full-time as a advertising copywriter at [employer]. He reports a depressed mood due to his substance use but denies any events of crying, loss of interest in things previously enjoyable, feelings of guilt or hopelessness, or  difficulties with concentration. He reports some worry related to his substance use and anticipates detoxing. He is currently reporting withdrawal symptoms, including hot and cold sweats, body aches, and insomnia. He reports sleeping about 6-8 hours nightly with the use of trazodone and states his appetite is good.  During evaluation Patrick Buck is sitting in an upright position in no acute distress.  He is alert & oriented x 4, calm, cooperative and attentive for this assessment.  His mood is reported as depressed with appropriate affect.  He has normal speech, and behavior.  Objectively there is no evidence of psychosis/mania or delusional thinking. Pt does not appear to be responding to internal or external stimuli.  Patient is able to converse coherently, goal directed thoughts, no distractibility, or pre-occupation.  He also denies suicidal/self-harm/homicidal ideation, psychosis, and paranoia.  Patient answered question appropriately.    Total Time spent with patient: 1 hour  Musculoskeletal  Strength & Muscle Tone: within normal limits Gait & Station: normal Patient leans: N/A  Psychiatric Specialty Exam  Presentation General Appearance:  Appropriate for Environment  Eye Contact: Fair  Speech: Clear and Coherent; Normal Rate  Speech Volume: Normal  Handedness: Right   Mood and Affect  Mood: Depressed  Affect: Appropriate   Thought Process  Thought Processes: Coherent  Descriptions of Associations:Intact  Orientation:Full (Time, Place and Person)  Thought Content:WDL    Hallucinations:Hallucinations: None  Ideas of Reference:None  Suicidal Thoughts:Suicidal Thoughts: No  Homicidal Thoughts:Homicidal Thoughts: No   Sensorium  Memory: Immediate Fair  Judgment: Fair  Insight: Good   Executive Functions  Concentration: Good  Attention Span: Good  Recall: Fair  Fund of Knowledge: Fair  Language: Fair   Psychomotor Activity   Psychomotor Activity: Psychomotor Activity: Normal   Assets  Assets: Manufacturing Systems Engineer; Desire for Improvement; Housing; Vocational/Educational   Sleep  Sleep: Sleep: Good   Nutritional Assessment (For OBS and [facility] admissions only) Has the patient had a weight loss or gain of 10 pounds or more in the last 3 months?: No Has the patient had a decrease in food intake/or appetite?: No Does the patient have dental problems?: No Does the patient have eating habits or behaviors that may be indicators of an eating disorder including binging or inducing vomiting?: No Has the patient recently lost weight without trying?: 0 Has the patient been eating poorly because of a decreased appetite?: 0 Malnutrition Screening Tool Score: 0    Physical Exam Vitals reviewed.  Constitutional:      Appearance: Normal appearance.  HENT:     Head: Normocephalic and atraumatic.     Nose: Nose normal.     Mouth/Throat:     Pharynx: Oropharynx is clear.  Cardiovascular:     Rate and Rhythm: Normal rate.  Pulmonary:     Effort: Pulmonary effort is normal.  Musculoskeletal:        General: Normal range of motion.     Cervical back: Normal range of motion.  Neurological:     Mental Status: He is alert and oriented to person, place, and time.  Psychiatric:        Attention and Perception: Attention and perception normal.        Mood and Affect: Mood is depressed.        Speech: Speech normal.        Behavior: Behavior normal. Behavior is cooperative.        Thought Content: Thought content normal.        Cognition and Memory: Cognition and memory normal.        Judgment: Judgment normal.    Review of Systems  Psychiatric/Behavioral:  Positive for depression and substance abuse. The patient is nervous/anxious and has insomnia.   All other systems reviewed and are negative.   Blood pressure 137/78, pulse 75, temperature 98.6 F (37 C), temperature source Oral, resp. rate 20, SpO2 99%.  There is no height or weight on file to calculate BMI.  Past Psychiatric History: Non-reported  Is the patient at risk to self? No  Has the patient been a risk to self in the past 6 months? No .    Has the patient been a risk to self within the distant past? No   Is the patient a risk to others? No   Has the patient been a risk to others in the past 6 months? No   Has the patient been a risk to others within the distant past? No   Past Medical History: Wolff-Parkinson-White syndrome  Family History: Non-reported  Social History: Lives with his parents, is single, has no children, holds a high education officer, community, and works full-time at [employer].  Last Labs:  Admission on 02/14/2024, Discharged on 02/15/2024  Component Date Value Ref Range Status   SARS Coronavirus 2 by RT PCR 02/14/2024 NEGATIVE  NEGATIVE Final   Comment: (NOTE) SARS-CoV-2 target nucleic acids are NOT DETECTED.  The SARS-CoV-2 RNA is generally detectable in upper respiratory specimens during the acute phase of infection. The lowest concentration of SARS-CoV-2 viral copies this assay can detect is 138 copies/mL. A negative result does not preclude SARS-Cov-2 infection and should not be  used as the sole basis for treatment or other patient management decisions. A negative result may occur with  improper specimen collection/handling, submission of specimen other than nasopharyngeal swab, presence of viral mutation(s) within the areas targeted by this assay, and inadequate number of viral copies(<138 copies/mL). A negative result must be combined with clinical observations, patient history, and epidemiological information. The expected result is Negative.  Fact Sheet for Patients:  bloggercourse.com  Fact Sheet for Healthcare Providers:  seriousbroker.it  This test is no                          t yet approved or cleared by the [place]  FDA and  has been  authorized for detection and/or diagnosis of SARS-CoV-2 by FDA under an Emergency Use Authorization (EUA). This EUA will remain  in effect (meaning this test can be used) for the duration of the COVID-19 declaration under Section 564(b)(1) of the Act, 21 U.S.C.section 360bbb-3(b)(1), unless the authorization is terminated  or revoked sooner.       Influenza A by PCR 02/14/2024 NEGATIVE  NEGATIVE Final   Influenza B by PCR 02/14/2024 NEGATIVE  NEGATIVE Final   Comment: (NOTE) The Xpert Xpress SARS-CoV-2/FLU/RSV plus assay is intended as an aid in the diagnosis of influenza from Nasopharyngeal swab specimens and should not be used as a sole basis for treatment. Nasal washings and aspirates are unacceptable for Xpert Xpress SARS-CoV-2/FLU/RSV testing.  Fact Sheet for Patients: bloggercourse.com  Fact Sheet for Healthcare Providers: seriousbroker.it  This test is not yet approved or cleared by the [place]  FDA and has been authorized for detection and/or diagnosis of SARS-CoV-2 by FDA under an Emergency Use Authorization (EUA). This EUA will remain in effect (meaning this test can be used) for the duration of the COVID-19 declaration under Section 564(b)(1) of the Act, 21 U.S.C. section 360bbb-3(b)(1), unless the authorization is terminated or revoked.     Resp Syncytial Virus by PCR 02/14/2024 NEGATIVE  NEGATIVE Final   Comment: (NOTE) Fact Sheet for Patients: bloggercourse.com  Fact Sheet for Healthcare Providers: seriousbroker.it  This test is not yet approved or cleared by the [place]  FDA and has been authorized for detection and/or diagnosis of SARS-CoV-2 by FDA under an Emergency Use Authorization (EUA). This EUA will remain in effect (meaning this test can be used) for the duration of the COVID-19 declaration under Section 564(b)(1) of the Act, 21  U.S.C. section 360bbb-3(b)(1), unless the authorization is terminated or revoked.  Performed at [facility], [address], [place], [place] [address]     Allergies: Patient has no known allergies.  Medications:     Medical Decision Making  Pt admitted to continuous observation and would be transferred to [facility] when a bed is available.    Labs: -CBC w/ differential/platelet -CMP -Ethanol -Lipid panel -Hemoglobin A1c -TSH  -EKG 12-Lead    Medications: -Tylenol , 650mg , oral, every 6 hours PRN, mild pain -Atarax 25mg , oral, 3 times daily PRN, anxiety -Desyrel, 50mg , oral, at bedtime PRN, sleep -Benadryl 50mg  at Q8HRS PO PRN - allergies -Naproxen 500 mg BID as needed for pain -Bentyl 20 mg every 6 hours as needed for spasms/abdominal cramping -Robaxin 500 mg every 8 hours as needed for muscle spasms -Zofran  4 mg every 6 hours as needed for nausea or vomiting -Imodium 2 to 4 mg as needed for diarrhea or loose stools -Maalox/Mylanta 30 mL every 4 hours as needed for indigestion -Milk of  Mag 30 mL as needed for constipation    COWS protocol -Clonidine taper -Agitation protocol Recommendations  Based on my evaluation the patient does not appear to have an emergency medical condition.  Tosin Rinoa Garramone, NP 02/27/24  10:19 AM

## 2024-02-27 NOTE — Progress Notes (Signed)
Pt transferred to [facility]. 

## 2024-02-27 NOTE — Group Note (Signed)
 Group Topic: Social Support  Group Date: 02/27/2024 Start Time: 2000 End Time: 2100 Facilitators: Joan Plowman B  Department: [facility]  Number of Participants: 6  Group Focus: daily focus and goals/reality orientation Treatment Modality:  Individual Therapy Interventions utilized were leisure development Purpose: express feelings  Name: Rayquon Uselman Date of Birth: January 01, 1991  MR: [id number]    Level of Participation: PT DID NOT ATTEND GROUP   Patients Problems:  Patient Active Problem List   Diagnosis Date Noted   Opioid dependence with opioid-induced mood disorder (HCC) 02/27/2024   Wolff-Parkinson-White (WPW) syndrome 07/13/2010

## 2024-02-27 NOTE — Group Note (Signed)
 Group Topic: Overcoming Obstacles  Group Date: 02/27/2024 Start Time: 0200 End Time: 1420 Facilitators: Lonzell Dwayne RAMAN, NT  Department: [facility]  Number of Participants: 3  Group Focus: acceptance Treatment Modality:  Behavior Modification Therapy Interventions utilized were clarification Purpose: increase insight  Name: Patrick Buck Date of Birth: May 19, 1990  MR: [id number]    Level of Participation: Patient did not attend group Quality of Participation: N/A Interactions with others: N/A Mood/Affect: N/A Triggers (if applicable): N/A Cognition: N/A Progress: N/A Response: N/A Plan: N/A  Patients Problems:  Patient Active Problem List   Diagnosis Date Noted   Opioid dependence with opioid-induced mood disorder (HCC) 02/27/2024   Wolff-Parkinson-White (WPW) syndrome 07/13/2010

## 2024-02-27 NOTE — ED Notes (Signed)
 Patient is in the bedroom calm and composed, NAD. Denies needing anything.  Environment secured per policy. Will monitor for safety.

## 2024-02-27 NOTE — BH Assessment (Addendum)
 Comprehensive Clinical Assessment (CCA) Note  02/27/2024 [facility] [id number]  Disposition: Per Patrick Mccoy, NP admission to [facility] is recommended.    The patient demonstrates the following risk factors for suicide: Chronic risk factors for suicide include: psychiatric disorder of Unspecified depression, mild and substance use disorder. Acute risk factors for suicide include: social withdrawal/isolation. Protective factors for this patient include: positive social support, responsibility to others (children, family), and hope for the future. Considering these factors, the overall suicide risk at this point appears to be low. Patient is appropriate for outpatient follow up, once stabilized.   Patient is a 33 year old male with a history of Opioid Use Disorder, severe who presents voluntarily to [facility] for [facility].  Patient presents unaccompanied. He states he is looking for detox.  Patient reports he has been smoking 1 gram of fentanyl  daily for the past 2-3 years.  He has [facility] to [facility], approximately 3 yrs ago.  He states he was at [facility] [place] and relapsed several weeks in to rehab there.  Patient reports his last use was 1 gram, early this morning.  He is reporting mild withdrawal sx of sweats and body aches.  Patient denies psychiatric history outside of SA issues.  He does identify some signs of depression, reporting it is related to his struggle with fentanyl  use.  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  He is aware he will likely lose this position when he enters SA treatment, however he is okay with this.  Patient lives with his parents and 7 other siblings (of various ages).  He states he is seeking detox, with a plan to pursue residential SA tx beyond detox tx.  He is hoping to be referred to [facility].  Patient denies SI, HI and AVH.     Chief Complaint:  Chief Complaint  Patient presents with   Addiction Problem    Visit Diagnosis: Opioid Use Disorder, severe    CCA Screening, Triage and Referral (STR)  Patient Reported Information How did you hear about us ? Self  What Is the Reason for Your Visit/Call Today? [name] is a 33 year old male presenting to [facility] unaccompanied. Pt states he is looking for detox at this time of triage. Pt is looking to get off of Fentanyl . Pt states that he has been struggling with Fentanyl  daily. Pt reorts he last smoked 3 hours ago roughly less than a gram. Pt states he has been to [facility] before and is looking for a similar process for detox. Pt denies alcohol use in the past 24 hours, Si, Hi and AVH.  How Long Has This Been Causing You Problems? > than 6 months  What Do You Feel Would Help You the Most Today? Alcohol or Drug Use Treatment   Have You Recently Had Any Thoughts About Hurting Yourself? No  Are You Planning to Commit Suicide/Harm Yourself At This time? No   Flowsheet Row ED from 02/27/2024 in [facility] Most recent reading at 02/27/2024  9:38 AM ED from 06/01/2023 in [facility] Most recent reading at 06/01/2023  7:43 PM ED from 06/01/2023 in [facility] Most recent reading at 06/01/2023  1:46 PM  C-SSRS RISK CATEGORY No Risk No Risk No Risk    Have you Recently Had Thoughts About Hurting Someone [place]? No  Are You Planning to Harm Someone at This Time?  No  Explanation: N/A   Have You Used Any Alcohol or Drugs in the Past 24 Hours? Yes  How Long Ago Did You Use Drugs or Alcohol? This morning What Did You Use and How Much? Used today, half a gram   Do You Currently Have a Therapist/Psychiatrist? No  Name of Therapist/Psychiatrist:    Have You Been Recently Discharged From Any Office Practice or Programs? No  Explanation of Discharge From Practice/Program: None    CCA Screening Triage Referral Assessment Type of Contact:  Face-to-Face  Telemedicine Service Delivery:   Is this Initial or Reassessment?   Date Telepsych consult ordered in [facility]:    Time Telepsych consult ordered in [facility]:    [facility] of [facility]: [facility]  Provider Location: [facility]   Collateral Involvement: None   Does Patient Have a Automotive Engineer Guardian? No  Legal Guardian Contact Information: N/A  Copy of Legal Guardianship Form: -- (N/A)  Legal Guardian Notified of Arrival: -- (N/A)  Legal Guardian Notified of Pending Discharge: -- (N/A)  If Minor and Not Living with Parent(s), Who has Custody? N/A  Is CPS involved or ever been involved? Never  Is APS involved or ever been involved? Never   Patient Determined To Be At Risk for Harm To Self or Others Based on Review of Patient Reported Information or Presenting Complaint? No  Method: -- (N/A, no HI)  Availability of Means: -- (N/A, no HI)  Intent: -- (N/A, no HI)  Notification Required: -- (N/A, no HI)  Additional Information for Danger to Others Potential: -- (N/A, no HI)  Additional Comments for Danger to Others Potential: N/A, no HI  Are There Guns or Other Weapons in Your Home? No  Types of Guns/Weapons: N/A  Are These Weapons Safely Secured?                            -- (N/A)  Who Could Verify You Are Able To Have These Secured: N/A  Do You Have any Outstanding Charges, Pending Court Dates, Parole/Probation? None  Contacted To Inform of Risk of Harm To Self or Others: -- (N/A)    Does Patient Present under Involuntary Commitment? No    [place] of Residence: [place]   Patient Currently Receiving the Following [facility]: Not Receiving [facility]   Determination of Need: Urgent (48 hours)   Options For Referral: Facility-Based Crisis     CCA Biopsychosocial Patient Reported Schizophrenia/Schizoaffective Diagnosis in Past: No   Strengths: Patient is seeking treatment.  Parents are  supportive.   Mental Health Symptoms Depression:  Difficulty Concentrating; Hopelessness (in context of ongoing SA issues)   Duration of Depressive symptoms: Duration of Depressive Symptoms: Greater than two weeks   Mania:  None   Anxiety:   Worrying; Tension   Psychosis:  None   Duration of Psychotic symptoms:    Trauma:  None   Obsessions:  None   Compulsions:  None   Inattention:  N/A   Hyperactivity/Impulsivity:  N/A   Oppositional/Defiant Behaviors:  N/A   Emotional Irregularity:  None   Other Mood/Personality Symptoms:  NA    Mental Status Exam Appearance and self-Buck  Stature:  Average   Weight:  Average weight   Clothing:  Casual   Grooming:  Normal   Cosmetic use:  None   Posture/gait:  Normal   Motor activity:  Not Remarkable   Sensorium  Attention:  Normal   Concentration:  Normal   Orientation:  X5   Recall/memory:  Normal   Affect and Mood  Affect:  Anxious   Mood:  Depressed; Anxious   Relating  Eye contact:  Normal   Facial expression:  Responsive   Attitude toward examiner:  Cooperative   Thought and Language  Speech flow: Clear and Coherent   Thought content:  Appropriate to Mood and Circumstances   Preoccupation:  None   Hallucinations:  None   Organization:  Intact   [employer] Buck of Knowledge:  Average   Intelligence:  Average   Abstraction:  Normal   Judgement:  Fair   Dance Movement Psychotherapist:  Adequate   Insight:  Gaps   Decision Making:  Normal   Social Functioning  Social Maturity:  Responsible   Social Judgement:  Normal   Stress  Stressors:  Transitions; Illness (ongoing SA issues)   Coping Ability:  Overwhelmed   Skill Deficits:  Self-control; Decision making   Supports:  Family     Religion: Religion/Spirituality Are You A Religious Person?: No How Might This Affect Treatment?: N/A  Leisure/Recreation: Leisure / Recreation Do You Have Hobbies?:  No  Exercise/Diet: Exercise/Diet Do You Exercise?: No Have You Gained or Lost A Significant Amount of Weight in the Past Six Months?: No Do You Follow a Special Diet?: No Do You Have Any Trouble Sleeping?: No   CCA Employment/Education Employment/Work Situation: Employment / Work Situation Employment Situation: Employed Work Stressors: Denies Passenger Transport Manager has Been Impacted by Current Illness: No (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?: No  Education: Education Is Patient Currently Attending School?: No Last Grade Completed: 12 Did You Attend College?: No Did You Have An Individualized Education Program (IIEP): No Did You Have Any Difficulty At School?: No Patient's Education Has Been Impacted by Current Illness: No   CCA Family/Childhood History Family and Relationship History: Family history Marital status: Single Does patient have children?: No  Childhood History:  Childhood History By whom was/is the patient raised?: Both parents Did patient suffer any verbal/emotional/physical/sexual abuse as a child?: No Did patient suffer from severe childhood neglect?: No Has patient ever been sexually abused/assaulted/raped as an adolescent or adult?: No Was the patient ever a victim of a crime or a disaster?: No Witnessed domestic violence?: No Has patient been affected by domestic violence as an adult?: No       CCA Substance Use Alcohol/Drug Use: Alcohol / Drug Use Pain Medications: See MAR Prescriptions: See MAR Over the Counter: See MAR History of alcohol / drug use?: Yes Longest period of sobriety (when/how long): few days Negative Consequences of Use: Financial Withdrawal Symptoms: Sweats, Cramps Substance #1 Name of Substance 1: Fentanyl  1 - Age of First Use: 29 1 - Amount (size/oz): 1 gram 1 - Frequency: daily 1 - Duration: 2-3 yrs 1 - Last Use / Amount:  early this morning - 1 gram 1 - Method of Aquiring: buys 1- Route of Use: smokes                       ASAM's:  Six Dimensions of Multidimensional Assessment  Dimension 1:  Acute Intoxication and/or Withdrawal Potential:   Dimension 1:  Description of individual's past and current experiences of substance use and withdrawal: mild w/d symptoms today  Dimension 2:  Biomedical Conditions and Complications:   Dimension 2:  Description of patient's  biomedical conditions and  complications: No medical issues  Dimension 3:  Emotional, Behavioral, or Cognitive Conditions and Complications:  Dimension 3:  Description of emotional, behavioral, or cognitive conditions and complications: reports depression related to ongoing SA issues  Dimension 4:  Readiness to Change:  Dimension 4:  Description of Readiness to Change criteria: Seeking detox, followed by residential tx at [facility] if accepted.  Dimension 5:  Relapse, Continued use, or Continued Problem Potential:  Dimension 5:  Relapse, continued use, or continued problem potential critiera description: Seems motivated towards sobriety.  Dimension 6:  Recovery/Living Environment:  Dimension 6:  Recovery/Iiving environment criteria description: family is supportive  ASAM Severity Score: ASAM's Severity Rating Score: 4  ASAM Recommended Level of Treatment: ASAM Recommended Level of Treatment: Level III Residential Treatment   Substance use Disorder (SUD) Substance Use Disorder (SUD)  Checklist Symptoms of Substance Use: Continued use despite persistent or recurrent social, interpersonal problems, caused or exacerbated by use, Evidence of tolerance, Evidence of withdrawal (Comment), Persistent desire or unsuccessful efforts to cut down or control use  Recommendations for Buck/Supports/Treatments: Recommendations for Buck/Supports/Treatments Recommendations For Buck/Supports/Treatments: Detox, Facility Based Crisis  Disposition  Recommendation per psychiatric provider: We recommend transfer to [facility].   DSM5 Diagnoses: Patient Active Problem List   Diagnosis Date Noted   Wolff-Parkinson-White (WPW) syndrome 07/13/2010     Referrals to Alternative Service(s): Referred to Alternative Service(s):   Place:   Date:   Time:    Referred to Alternative Service(s):   Place:   Date:   Time:    Referred to Alternative Service(s):   Place:   Date:   Time:    Referred to Alternative Service(s):   Place:   Date:   Time:     Deland LITTIE Louder, [facility] [place]

## 2024-02-28 DIAGNOSIS — I456 Pre-excitation syndrome: Secondary | ICD-10-CM | POA: Diagnosis not present

## 2024-02-28 DIAGNOSIS — F1420 Cocaine dependence, uncomplicated: Secondary | ICD-10-CM | POA: Diagnosis not present

## 2024-02-28 DIAGNOSIS — F1124 Opioid dependence with opioid-induced mood disorder: Secondary | ICD-10-CM | POA: Diagnosis not present

## 2024-02-28 DIAGNOSIS — F17210 Nicotine dependence, cigarettes, uncomplicated: Secondary | ICD-10-CM | POA: Diagnosis not present

## 2024-02-28 MED ORDER — NICOTINE POLACRILEX 2 MG MT GUM
2.0000 mg | CHEWING_GUM | OROMUCOSAL | Status: DC | PRN
  Administered 2024-02-28: 2 mg via ORAL
  Filled 2024-02-28 (×2): qty 1

## 2024-02-28 MED ORDER — NICOTINE POLACRILEX 2 MG MT GUM: 2.0000 mg | CHEWING_GUM | OROMUCOSAL | Status: DC

## 2024-02-28 MED ORDER — CLONIDINE HCL 0.1 MG PO TABS: ORAL_TABLET | ORAL | 0 refills | Status: AC

## 2024-02-28 MED ORDER — TRAZODONE HCL 50 MG PO TABS: 50.0000 mg | ORAL_TABLET | Freq: Every evening | ORAL | 0 refills | Status: AC | PRN

## 2024-02-28 MED ORDER — HYDROXYZINE HCL 25 MG PO TABS: 25.0000 mg | ORAL_TABLET | Freq: Four times a day (QID) | ORAL | 0 refills | Status: AC | PRN

## 2024-02-28 NOTE — ED Notes (Signed)
 Patient presents slightly anxious. Patient denies SI,HI, and A/V/H with no plan or intent. Patient is med compliant and was given hydroxyzine po prn due to anxiety. Patient ate breakfast, denies any pain or major discomfort, and remains cooperative in unit.

## 2024-02-28 NOTE — ED Notes (Signed)
 Patient is discharging at this time. Patient denies SI,HI, and A/V/H with no plan/intent. Printed AVS reviewed with and given to patient along with medications and follow up appointments. Patient verbalized all understanding. All valuables/belongings returned to patient. Patient is being transported by [employer] taxi. Patient denies any pain/discomfort. No s/s of current distress.

## 2024-02-28 NOTE — ED Provider Notes (Signed)
 FBC/OBS ASAP Discharge Summary  Date and Time: 02/28/2024 6:10 PM  Name: Patrick Buck  MRN:  [id number]   Discharge Diagnoses:  Final diagnoses:  Opioid dependence with opioid-induced mood disorder (HCC)  Wolff-Parkinson-White (WPW) syndrome   Subjective: Patient eager to discharge. He would like to continue clonidine taper and requests medication for anxiety (hydroxyzine). He plans to stay with family until bed available at [facility]. He plans to go to work tomorrow. He believes he has sufficient supports to avoid relapse.   Stay Summary: Patient engaged in multimodal treatment for polysubstance use disorder. Patient quickly adjusted to milieu. The patient was evaluated each day by a clinical provider to ascertain response to treatment. Clonidine taper initiated on admission but patient made it clear his plan was an expedited return to [facility] so he could continue to work as opposed to transition to a residential substance treatment program.  Improvement was noted by the patient's report of decreasing symptoms, improved sleep and appetite, affect, medication tolerance, behavior, and participation in unit programming. Patient's care was discussed during the interdisciplinary team meeting every day during the hospitalization.  Patient was asked each day to complete a self inventory noting mood, mental status, pain, new symptoms, anxiety and concerns. Labs were reviewed with the patient, and abnormal results were discussed with the patient.   Symptoms were reported as significantly decreased or resolved completely by discharge. Patient anticipates impending bed availability at [facility] but requested discharge 12/2 to the home of relative so he could return to work tomorrow 12/3 (as scheduled).    The patient denied having side effects to prescribed psychiatric medication. Unclear response to PRN hydroxyzine.  Total Time spent with patient: I personally spent 20 minutes on the unit in  direct patient care. The direct patient care time included face-to-face time with the patient, reviewing the patient's chart, communicating with other professionals, and coordinating care. Greater than 50% of this time was spent in counseling or coordinating care with the patient regarding goals of hospitalization, psycho-education, and discharge planning needs.  On my assessment the patient denied SI, HI, AVH, paranoia, ideas of reference, or first rank symptoms on day of discharge. Patient denied pervasive drug cravings or active signs of withdrawal. Patient denied medication side-effects. Patient was not deemed to be a danger to self or others on day of discharge and was in agreement with discharge plans.   Past Psychiatric History: Cocaine use disorder, polysubstance use disorder, opioid dependence with opioid endorse mood disorder Past Medical History: Wolff-Parkinson-White syndrome Family History: None reported Family Psychiatric  History: None reported Social History: [facility] most recently [facility] [place] for 2-3 weeks before asked to leave related to opioid use. Admission to [facility] was court-ordered. Tobacco Cessation:  A prescription for an FDA-approved tobacco cessation medication was offered at discharge and the patient refused  Current Medications:  Current Facility-Administered Medications  Medication Dose Route Frequency Provider Last Rate Last Admin   acetaminophen  (TYLENOL ) tablet 650 mg  650 mg Oral Q6H PRN Olasunkanmi, Oluwatosin, NP       alum & mag hydroxide-simeth (MAALOX/MYLANTA) 200-200-20 MG/5ML suspension 30 mL  30 mL Oral Q4H PRN Olasunkanmi, Oluwatosin, NP       cloNIDine (CATAPRES) tablet 0.1 mg  0.1 mg Oral QID Olasunkanmi, Oluwatosin, NP   0.1 mg at 02/28/24 1459   Followed by   NOREEN ON 02/29/2024] cloNIDine (CATAPRES) tablet 0.1 mg  0.1 mg Oral BH-qamhs Olasunkanmi, Oluwatosin, NP  Followed by   NOREEN ON 03/03/2024] cloNIDine  (CATAPRES) tablet 0.1 mg  0.1 mg Oral QAC breakfast Olasunkanmi, Oluwatosin, NP       dicyclomine (BENTYL) tablet 20 mg  20 mg Oral Q6H PRN Olasunkanmi, Oluwatosin, NP   20 mg at 02/27/24 1809   haloperidol (HALDOL) tablet 5 mg  5 mg Oral TID PRN Olasunkanmi, Oluwatosin, NP       And   diphenhydrAMINE (BENADRYL) capsule 50 mg  50 mg Oral TID PRN Olasunkanmi, Oluwatosin, NP       diphenhydrAMINE (BENADRYL) capsule 50 mg  50 mg Oral Q6H PRN Olasunkanmi, Oluwatosin, NP       haloperidol lactate (HALDOL) injection 5 mg  5 mg Intramuscular TID PRN Olasunkanmi, Oluwatosin, NP       And   diphenhydrAMINE (BENADRYL) injection 50 mg  50 mg Intramuscular TID PRN Olasunkanmi, Oluwatosin, NP       And   LORazepam (ATIVAN) injection 2 mg  2 mg Intramuscular TID PRN Olasunkanmi, Oluwatosin, NP       haloperidol lactate (HALDOL) injection 10 mg  10 mg Intramuscular TID PRN Olasunkanmi, Oluwatosin, NP       And   diphenhydrAMINE (BENADRYL) injection 50 mg  50 mg Intramuscular TID PRN Olasunkanmi, Oluwatosin, NP       And   LORazepam (ATIVAN) injection 2 mg  2 mg Intramuscular TID PRN Olasunkanmi, Oluwatosin, NP       hydrOXYzine (ATARAX) tablet 25 mg  25 mg Oral Q6H PRN Olasunkanmi, Oluwatosin, NP   25 mg at 02/28/24 1729   loperamide (IMODIUM) capsule 2-4 mg  2-4 mg Oral PRN Olasunkanmi, Oluwatosin, NP       magnesium hydroxide (MILK OF MAGNESIA) suspension 30 mL  30 mL Oral Daily PRN Olasunkanmi, Oluwatosin, NP       methocarbamol (ROBAXIN) tablet 500 mg  500 mg Oral Q8H PRN Olasunkanmi, Oluwatosin, NP   500 mg at 02/27/24 1810   naproxen (NAPROSYN) tablet 500 mg  500 mg Oral BID PRN Olasunkanmi, Oluwatosin, NP       nicotine  polacrilex (NICORETTE) gum 2 mg  2 mg Oral Q4H PRN Dasie Ellouise CROME, FNP   2 mg at 02/28/24 1729   ondansetron  (ZOFRAN -ODT) disintegrating tablet 4 mg  4 mg Oral Q6H PRN Olasunkanmi, Oluwatosin, NP       traZODone (DESYREL) tablet 50 mg  50 mg Oral QHS PRN Olasunkanmi, Oluwatosin, NP   50  mg at 02/27/24 2120   Current Outpatient Medications  Medication Sig Dispense Refill   cloNIDine (CATAPRES) 0.1 MG tablet Take 1 tablet (0.1 mg total) by mouth 2 (two) times daily in the am and at bedtime. for 1 day, THEN 1 tablet (0.1 mg total) daily before breakfast for 1 day. 3 tablet 0   hydrOXYzine (ATARAX) 25 MG tablet Take 1 tablet (25 mg total) by mouth every 6 (six) hours as needed for anxiety. 30 tablet 0   traZODone (DESYREL) 50 MG tablet Take 1 tablet (50 mg total) by mouth at bedtime as needed for sleep. 30 tablet 0    PTA Medications:  Facility Ordered Medications  Medication   acetaminophen  (TYLENOL ) tablet 650 mg   alum & mag hydroxide-simeth (MAALOX/MYLANTA) 200-200-20 MG/5ML suspension 30 mL   magnesium hydroxide (MILK OF MAGNESIA) suspension 30 mL   haloperidol (HALDOL) tablet 5 mg   And   diphenhydrAMINE (BENADRYL) capsule 50 mg   haloperidol lactate (HALDOL) injection 5 mg   And   diphenhydrAMINE (BENADRYL) injection  50 mg   And   LORazepam (ATIVAN) injection 2 mg   haloperidol lactate (HALDOL) injection 10 mg   And   diphenhydrAMINE (BENADRYL) injection 50 mg   And   LORazepam (ATIVAN) injection 2 mg   diphenhydrAMINE (BENADRYL) capsule 50 mg   traZODone (DESYREL) tablet 50 mg   dicyclomine (BENTYL) tablet 20 mg   hydrOXYzine (ATARAX) tablet 25 mg   loperamide (IMODIUM) capsule 2-4 mg   methocarbamol (ROBAXIN) tablet 500 mg   naproxen (NAPROSYN) tablet 500 mg   ondansetron  (ZOFRAN -ODT) disintegrating tablet 4 mg   cloNIDine (CATAPRES) tablet 0.1 mg   Followed by   NOREEN ON 02/29/2024] cloNIDine (CATAPRES) tablet 0.1 mg   Followed by   NOREEN ON 03/03/2024] cloNIDine (CATAPRES) tablet 0.1 mg   nicotine  polacrilex (NICORETTE) gum 2 mg   PTA Medications  Medication Sig   traZODone (DESYREL) 50 MG tablet Take 1 tablet (50 mg total) by mouth at bedtime as needed for sleep.   cloNIDine (CATAPRES) 0.1 MG tablet Take 1 tablet (0.1 mg total) by mouth 2 (two)  times daily in the am and at bedtime. for 1 day, THEN 1 tablet (0.1 mg total) daily before breakfast for 1 day.   hydrOXYzine (ATARAX) 25 MG tablet Take 1 tablet (25 mg total) by mouth every 6 (six) hours as needed for anxiety.       02/28/2024    1:26 PM 07/06/2023    5:09 PM  Depression screen PHQ 2/9  Decreased Interest 0 0  Down, Depressed, Hopeless 3 0  PHQ - 2 Score 3 0  Altered sleeping 3 0  Tired, decreased energy 3 0  Change in appetite 0 0  Feeling bad or failure about yourself  0 0  Trouble concentrating 0 0  Moving slowly or fidgety/restless 0 0  Suicidal thoughts 0 0  PHQ-9 Score 9 0   Difficult doing work/chores Not difficult at all Not difficult at all     Data saved with a previous flowsheet row definition    Flowsheet Row ED from 02/27/2024 in [facility] Most recent reading at 02/27/2024 11:32 AM ED from 02/27/2024 in [facility] Most recent reading at 02/27/2024  9:38 AM ED from 06/01/2023 in [facility] Emergency Department at [facility] Most recent reading at 06/01/2023  7:43 PM  C-SSRS RISK CATEGORY No Risk No Risk No Risk    Musculoskeletal  Strength & Muscle Tone: within normal limits Gait & Station: normal Patient leans: N/A  Psychiatric Specialty Exam  Presentation  General Appearance:  Appropriate for Environment  Eye Contact: Good  Speech: Clear and Coherent  Speech Volume: Normal  Handedness: Right   Mood and Affect  Mood: Euthymic  Affect: Appropriate   Thought Process  Thought Processes: Coherent  Descriptions of Associations:Intact  Orientation:Full (Time, Place and Person)  Thought Content:Logical  Diagnosis of Schizophrenia or Schizoaffective disorder in past: No    Hallucinations:Hallucinations: None  Ideas of Reference:None  Suicidal Thoughts:Suicidal Thoughts: No  Homicidal Thoughts:Homicidal Thoughts: No   Sensorium  Memory: Immediate Good;  Recent Fair  Judgment: Fair  Insight: Fair   Art Therapist  Concentration: Good  Attention Span: Good  Recall: Good  Fund of Knowledge: Fair  Language: Good   Psychomotor Activity  Psychomotor Activity: Psychomotor Activity: Normal   Assets  Assets: Communication Skills; Desire for Improvement; Physical Health; Resilience; Social Support; Vocational/Educational   Sleep  Sleep: Sleep: Good  Estimated Sleeping Duration (Last  24 Hours): 10.00-13.50 hours  Nutritional Assessment (For OBS and FBC admissions only) Has the patient had a weight loss or gain of 10 pounds or more in the last 3 months?: No Has the patient had a decrease in food intake/or appetite?: No Does the patient have dental problems?: No Does the patient have eating habits or behaviors that may be indicators of an eating disorder including binging or inducing vomiting?: No Has the patient recently lost weight without trying?: 0 Has the patient been eating poorly because of a decreased appetite?: 0 Malnutrition Screening Tool Score: 0    Physical Exam  Physical Exam ROS Blood pressure 121/73, pulse 81, temperature (!) 97.5 F (36.4 C), temperature source Oral, resp. rate 16, SpO2 95%. There is no height or weight on file to calculate BMI.  Demographic Factors:  Male  Loss Factors: NA  Historical Factors: Family history of mental illness or substance abuse  Risk Reduction Factors:   Sense of responsibility to family  Continued Clinical Symptoms:  Alcohol/Substance Abuse/Dependencies  Cognitive Features That Contribute To Risk:  None    Suicide Risk:  Minimal: No identifiable suicidal ideation.  Patients presenting with no risk factors but with morbid ruminations; may be classified as minimal risk based on the severity of the depressive symptoms  Plan Of Care/Follow-up recommendations:  Plan Of Care/Follow-up recommendations:  Activity: Daily physical activity; reduce  sedentary behaviors   Diet: Portion-limited diet rich in produce, whole (minimally processed) grains, nuts/seeds, eggs, beans/legumes, seafood, lowfat dairy (if tolerated), fermented food, lean meat. Copious water intake. Limit (ultra)processed foods and sugar-sweetened beverages.    Other: -Follow-up with your outpatient psychiatric provider -instructions on appointment date, time, and address (location) are provided to you in discharge paperwork.   -Take your psychiatric medications as prescribed at discharge - instructions are provided to you in the discharge paperwork. Take clonidine one tablet in the morning, one at bedtime on Wed 12/3 and then take on tablet in the morning on Thu 12/4. This will complete your clonidine taper. Medication Rx faxed to [facility] on [address] at patient request.  Take your hydroxyzine 25mg  as needed for anxiety. Avoid driving or use of tools/machinery while using hydroxyzine.  Trazodone available as needed for insomnia.   -Follow-up with outpatient primary care doctor to optimize health maintenance/preventative care and other specialists -for management of chronic medical disease   -Recommend total abstinence from alcohol, tobacco, and other illicit drug use at discharge.    -If your psychiatric symptoms recur, worsen, or if you have side effects to your psychiatric medications, call your outpatient psychiatric provider, 911, [phone] or go to the nearest emergency department.   -If suicidal thoughts occur, immediately call your outpatient psychiatric provider, 911, [phone] or go to the nearest emergency department.   Disposition: Community to relative.  KANDI JAYSON HAHN, MD 02/28/2024, 6:10 PM

## 2024-02-28 NOTE — Group Note (Signed)
 Group Topic: Recovery Basics  Group Date: 02/28/2024 Start Time: 1100 End Time: 1150 Facilitators: Gerome Jolly, NT  Department: [facility]  Number of Participants: 3  Group Focus: acceptance Treatment Modality:  Cognitive Behavioral Therapy Interventions utilized were exploration Purpose: explore maladaptive thinking, regain self-worth, and relapse prevention strategies  Name: Patrick Buck Date of Birth: [birth year]  MR: [id number]    Level of Participation: Did not attend Quality of Participation: na Interactions with others: na Mood/Affect: na Triggers (if applicable): na Cognition: na Progress: na Response: Pat did not attend group Plan: patient will be encouraged to continue attending and participating groups  Patients Problems:  Patient Active Problem List   Diagnosis Date Noted   Opioid dependence with opioid-induced mood disorder (HCC) 02/27/2024   Wolff-Parkinson-White (WPW) syndrome 07/13/2010

## 2024-02-28 NOTE — Group Note (Signed)
 Group Topic: Positive Affirmations  Group Date: 02/28/2024 Start Time: 1500 End Time: 1540 Facilitators: Elnor Keven SAILOR  Department: [facility]  Number of Participants: 6  Group Focus: affirmation Treatment Modality:  Psychoeducation Interventions utilized were support Purpose: reinforce self-care  Name: Patrick Buck Date of Birth: 08-04-1990  MR: [id number]    Level of Participation: Pt did not attend group Quality of Participation: NA Interactions with others: NA Mood/Affect: NA Triggers (if applicable): NA Cognition: NA Progress: None Response: NA Plan: follow-up needed  Patients Problems:  Patient Active Problem List   Diagnosis Date Noted   Opioid dependence with opioid-induced mood disorder (HCC) 02/27/2024   Wolff-Parkinson-White (WPW) syndrome 07/13/2010

## 2024-02-28 NOTE — Group Note (Signed)
 Group Topic: Recovery Basics  Group Date: 02/28/2024 Start Time: 1600 End Time: 1630 Facilitators: Byrd, Maryelizabeth Eberle, RN  Department: [facility]  Number of Participants: 6  Group Focus: check in, communication, and nursing group Treatment Modality:  Psychoeducation Interventions utilized were clarification and patient education Purpose: express feelings, improve communication skills, and increase insight  Name: Patrick Buck Date of Birth: [birth year]  MR: [id number]    Level of Participation: active Quality of Participation: attentive and cooperative Interactions with others: gave feedback Mood/Affect: appropriate Triggers (if applicable):  Cognition: coherent/clear Progress: Gaining insight Response: appropriate Plan: changes to discharge plan  Patients Problems:  Patient Active Problem List   Diagnosis Date Noted   Opioid dependence with opioid-induced mood disorder (HCC) 02/27/2024   Wolff-Parkinson-White (WPW) syndrome 07/13/2010

## 2024-02-28 NOTE — ED Notes (Signed)
 Patient asleep at this time, No COWS assessment completed.

## 2024-02-28 NOTE — Progress Notes (Signed)
 72 Hour Document Signed Note  Patient Details Name: Patrick Buck MRN: [id number] DOB: 08-11-1990 Today's Date: 02/28/2024   72 Hour Signed Documentation:  Admission Status: Voluntary/72 hour document signed Date 72 hour document signed : 02/28/24 Time 72 hour document signed : [id number] Provider Notified (First and Last Name) (see details for LINK to note): Ellouise Dawn, NP    Felton Gainer 02/28/2024, 3:57 PM

## 2024-02-28 NOTE — Care Management (Signed)
 [facility] Care Management:  Writer spoke with the client's Probation Officer, Lang Glance [phone].  Writer informed him for the [facility] he has a Do Not Re-admit order.  Jacob confirmed the client can go to a sober living house in [place] instead.   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  Officer Edwards cell number: [phone].  Officer Edwards office number: [phone].  Officer Lang Glance instructed for the client to contact [employer] directly to provide updates.  He also reports if he will need transitional housing until a bed opens up he can have a place to go.  Writer informed P.O. they average stay here is 3-5 days.  Client reports he does have family here in this area as well that he can discharge to if needed.  Writer directed the client to call officer Edwards directly to confirm all information.   Officer Lang Glance did say [facility] will also reach him to him personally if a bed opens up.  Writer also encouraged the client to call on [facility] in [place] as well on his own to check for bed openings.

## 2024-02-28 NOTE — ED Provider Notes (Signed)
 Behavioral Health Progress Note  Date and Time: 02/28/2024 1:27 PM Name: Patrick Buck MRN:  [id number]  Subjective: Patient states when I get out of here I have to go [facility].  It is court ordered that I go there.  Patrick Buck [facility] in [place] for 2 to 3 weeks prior to relapse on opioids.  Patient asked to leave [facility] recovery facility related to his opioid use.  Patient understands he can be considered for readmission to [facility] after detox.  Patient admitted to [facility] behavioral health facility based crisis unit on 02/27/2024.  Clonidine taper initiated.  Patient continues to report symptoms of withdrawal including headache, generalized body aches, abdominal cramping, and hot and cold sweats. COWS score 1 this morning.  Patient endorses average sleep and appetite.  He is actively engaged with groups and attending community meals in dining area.  Patient exhibits appropriate behavior with staff and peers.  Patient is compliant with medications.  [name] reassessed by this nurse practitioner face-to-face.  He is seated, no apparent distress.  He is alert and oriented, pleasant and cooperative during assessment.  Patient presents with euthymic mood, congruent affect.  Patient denies SI/HI/AVH.  Patient prefers Nicorette gum to transdermal patch.  Order updated, transdermal patch removed prior to administration of Nicorette gum.  Patient offered support and encouragement.  Reviewed treatment plan, patient verbalizes understanding and agreement with plan.   Diagnosis:  Final diagnoses:  Opioid dependence with opioid-induced mood disorder (HCC)  Wolff-Parkinson-White (WPW) syndrome    Total Time spent with patient: 20 minutes  Past Psychiatric History: Cocaine use disorder, polysubstance use disorder, opioid dependence with opioid endorse mood disorder Past Medical History: Wolff-Parkinson-White syndrome Family History: None  reported Family Psychiatric  History: None reported Social History: [facility] [place] for [facility]-3 weeks before asked to leave related to opioid use. Admission to [facility] was court-ordered.  Additional Social History:                         Sleep: Good  Appetite:  Fair  Current Medications:  Current Facility-Administered Medications  Medication Dose Route Frequency Provider Last Rate Last Admin   acetaminophen  (TYLENOL ) tablet 650 mg  650 mg Oral Q6H PRN Olasunkanmi, Oluwatosin, NP       alum & mag hydroxide-simeth (MAALOX/MYLANTA) 200-200-20 MG/5ML suspension 30 mL  30 mL Oral Q4H PRN Olasunkanmi, Oluwatosin, NP       cloNIDine (CATAPRES) tablet 0.1 mg  0.1 mg Oral QID Olasunkanmi, Oluwatosin, NP   0.1 mg at 02/28/24 1036   Followed by   NOREEN ON 02/29/2024] cloNIDine (CATAPRES) tablet 0.1 mg  0.1 mg Oral BH-qamhs Olasunkanmi, Oluwatosin, NP       Followed by   NOREEN ON 03/03/2024] cloNIDine (CATAPRES) tablet 0.1 mg  0.1 mg Oral QAC breakfast Olasunkanmi, Oluwatosin, NP       dicyclomine (BENTYL) tablet 20 mg  20 mg Oral Q6H PRN Olasunkanmi, Oluwatosin, NP   20 mg at 02/27/24 1809   haloperidol (HALDOL) tablet 5 mg  5 mg Oral TID PRN Olasunkanmi, Oluwatosin, NP       And   diphenhydrAMINE (BENADRYL) capsule 50 mg  50 mg Oral TID PRN Olasunkanmi, Oluwatosin, NP       diphenhydrAMINE (BENADRYL) capsule 50 mg  50 mg Oral Q6H PRN Olasunkanmi, Oluwatosin, NP       haloperidol lactate (HALDOL) injection 5 mg  5 mg Intramuscular TID PRN Olasunkanmi, Oluwatosin, NP       And   diphenhydrAMINE (BENADRYL) injection 50 mg  50 mg Intramuscular TID PRN Olasunkanmi, Oluwatosin, NP       And   LORazepam (ATIVAN) injection 2 mg  2 mg Intramuscular TID PRN Olasunkanmi, Oluwatosin, NP       haloperidol lactate (HALDOL) injection 10 mg  10 mg Intramuscular TID PRN Olasunkanmi, Oluwatosin, NP       And   diphenhydrAMINE (BENADRYL) injection 50 mg  50  mg Intramuscular TID PRN Olasunkanmi, Oluwatosin, NP       And   LORazepam (ATIVAN) injection 2 mg  2 mg Intramuscular TID PRN Olasunkanmi, Oluwatosin, NP       hydrOXYzine (ATARAX) tablet 25 mg  25 mg Oral Q6H PRN Olasunkanmi, Oluwatosin, NP   25 mg at 02/28/24 1037   loperamide (IMODIUM) capsule 2-4 mg  2-4 mg Oral PRN Olasunkanmi, Oluwatosin, NP       magnesium hydroxide (MILK OF MAGNESIA) suspension 30 mL  30 mL Oral Daily PRN Olasunkanmi, Oluwatosin, NP       methocarbamol (ROBAXIN) tablet 500 mg  500 mg Oral Q8H PRN Olasunkanmi, Oluwatosin, NP   500 mg at 02/27/24 1810   naproxen (NAPROSYN) tablet 500 mg  500 mg Oral BID PRN Olasunkanmi, Oluwatosin, NP       nicotine  polacrilex (NICORETTE) gum 2 mg  2 mg Oral Q4H while awake Dasie Ellouise CROME, FNP       ondansetron  (ZOFRAN -ODT) disintegrating tablet 4 mg  4 mg Oral Q6H PRN Olasunkanmi, Oluwatosin, NP       traZODone (DESYREL) tablet 50 mg  50 mg Oral QHS PRN Olasunkanmi, Oluwatosin, NP   50 mg at 02/27/24 2120   Current Outpatient Medications  Medication Sig Dispense Refill   traZODone (DESYREL) 50 MG tablet Take 50 mg by mouth at bedtime.      Labs  Lab Results:  Admission on 02/27/2024, Discharged on 02/27/2024  Component Date Value Ref Range Status   WBC 02/27/2024 8.1  4.0 - 10.5 K/uL Final   RBC 02/27/2024 4.92  4.22 - 5.81 MIL/uL Final   Hemoglobin 02/27/2024 14.9  13.0 - 17.0 g/dL Final   HCT 87/98/7974 45.1  39.0 - 52.0 % Final   MCV 02/27/2024 91.7  80.0 - 100.0 fL Final   MCH 02/27/2024 30.3  26.0 - 34.0 pg Final   MCHC 02/27/2024 33.0  30.0 - 36.0 g/dL Final   RDW 87/98/7974 13.1  11.5 - 15.5 % Final   Platelets 02/27/2024 250  150 - 400 K/uL Final   nRBC 02/27/2024 0.0  0.0 - 0.2 % Final   Neutrophils Relative % 02/27/2024 75  % Final   Neutro Abs 02/27/2024 6.0  1.7 - 7.7 K/uL Final   Lymphocytes Relative 02/27/2024 17  % Final   Lymphs Abs 02/27/2024 1.4  0.7 - 4.0 K/uL Final   Monocytes Relative 02/27/2024 6  %  Final   Monocytes Absolute 02/27/2024 0.5  0.1 - 1.0 K/uL Final   Eosinophils Relative 02/27/2024 1  % Final   Eosinophils Absolute 02/27/2024 0.1  0.0 - 0.5 K/uL Final   Basophils Relative 02/27/2024 1  % Final   Basophils Absolute 02/27/2024 0.1  0.0 - 0.1 K/uL Final   Immature Granulocytes 02/27/2024 0  % Final   Abs Immature Granulocytes 02/27/2024 0.02  0.00 - 0.07 K/uL Final   Performed at [facility], [address] [place],  [place] [address]   Sodium 02/27/2024 141  135 - 145 mmol/L Final   Potassium 02/27/2024 4.2  3.5 - 5.1 mmol/L Final   Chloride 02/27/2024 102  98 - 111 mmol/L Final   CO2 02/27/2024 31  22 - 32 mmol/L Final   Glucose, Bld 02/27/2024 122 (H)  70 - 99 mg/dL Final   Glucose reference range applies only to samples taken after fasting for at least 8 hours.   BUN 02/27/2024 11  6 - 20 mg/dL Final   Creatinine, Ser 02/27/2024 0.79  0.61 - 1.24 mg/dL Final   Calcium  02/27/2024 8.9  8.9 - 10.3 mg/dL Final   Total Protein 87/98/7974 6.4 (L)  6.5 - 8.1 g/dL Final   Albumin 87/98/7974 3.4 (L)  3.5 - 5.0 g/dL Final   AST 87/98/7974 22  15 - 41 U/L Final   ALT 02/27/2024 25  0 - 44 U/L Final   Alkaline Phosphatase 02/27/2024 64  38 - 126 U/L Final   Total Bilirubin 02/27/2024 1.1  0.0 - 1.2 mg/dL Final   GFR, Estimated 02/27/2024 >60  >60 mL/min Final   Comment: (NOTE) Calculated using the CKD-EPI Creatinine Equation (2021)    Anion gap 02/27/2024 8  5 - 15 Final   Performed at [facility], [address] [place], [place] [address]   Magnesium 02/27/2024 1.9  1.7 - 2.4 mg/dL Final   Performed at [facility], [address], [place], [place] [address]   Alcohol, Ethyl (B) 02/27/2024 <15  <15 mg/dL Final   Comment: (NOTE) For medical purposes only. Performed at [facility], [address] [place], [place] [address]    Cholesterol 02/27/2024 148  0 - 200 mg/dL Final   Triglycerides 87/98/7974 41  <150 mg/dL Final   HDL 87/98/7974 54   >40 mg/dL Final   Total CHOL/HDL Ratio 02/27/2024 2.7  RATIO Final   VLDL 02/27/2024 8  0 - 40 mg/dL Final   LDL Cholesterol 02/27/2024 86  0 - 99 mg/dL Final   Comment:        Total Cholesterol/HDL:CHD Risk Coronary Heart Disease Risk Table                     Men   Women  1/2 Average Risk   3.4   3.3  Average Risk       5.0   4.4  2 X Average Risk   9.6   7.1  3 X Average Risk  23.4   11.0        Use the calculated Patient Ratio above and the CHD Risk Table to determine the patient's CHD Risk.        ATP III CLASSIFICATION (LDL):  <100     mg/dL   Optimal  899-870  mg/dL   Near or Above                    Optimal  130-159  mg/dL   Borderline  839-810  mg/dL   High  >809     mg/dL   Very High Performed at [facility], [address] [place], [place] [address]    TSH 02/27/2024 0.365  0.350 - 4.500 uIU/mL Final   Comment: Performed by a 3rd Generation assay with a functional sensitivity of <=0.01 uIU/mL. Performed at [facility], [address] [place], [place] [address]    POC Amphetamine UR 02/27/2024 None Detected  NONE DETECTED (Cut Off Level 1000 ng/mL) Final   POC Secobarbital (BAR) 02/27/2024 None Detected  NONE DETECTED (Cut Off Level 300 ng/mL) Final   POC Buprenorphine  (BUP) 02/27/2024 None Detected  NONE DETECTED (Cut Off Level 10 ng/mL) Final   POC Oxazepam (BZO) 02/27/2024 None Detected  NONE DETECTED (Cut Off Level 300 ng/mL) Final   POC Cocaine UR 02/27/2024 Positive (A)  NONE DETECTED (Cut Off Level 300 ng/mL) Final   POC Methamphetamine UR 02/27/2024 None Detected  NONE DETECTED (Cut Off Level 1000 ng/mL) Final   POC Morphine 02/27/2024 None Detected  NONE DETECTED (Cut Off Level 300 ng/mL) Final   POC Methadone UR 02/27/2024 None Detected  NONE DETECTED (Cut Off Level 300 ng/mL) Final   POC Oxycodone  UR 02/27/2024 None Detected  NONE DETECTED (Cut Off Level 100 ng/mL) Final   POC Marijuana UR 02/27/2024 None Detected  NONE DETECTED (Cut Off  Level 50 ng/mL) Final  Admission on 02/14/2024, Discharged on 02/15/2024  Component Date Value Ref Range Status   SARS Coronavirus 2 by RT PCR 02/14/2024 NEGATIVE  NEGATIVE Final   Comment: (NOTE) SARS-CoV-2 target nucleic acids are NOT DETECTED.  The SARS-CoV-2 RNA is generally detectable in upper respiratory specimens during the acute phase of infection. The lowest concentration of SARS-CoV-2 viral copies this assay can detect is 138 copies/mL. A negative result does not preclude SARS-Cov-2 infection and should not be used as the sole basis for treatment or other patient management decisions. A negative result may occur with  improper specimen collection/handling, submission of specimen other than nasopharyngeal swab, presence of viral mutation(s) within the areas targeted by this assay, and inadequate number of viral copies(<138 copies/mL). A negative result must be combined with clinical observations, patient history, and epidemiological information. The expected result is Negative.  Fact Sheet for Patients:  bloggercourse.com  Fact Sheet for Healthcare Providers:  seriousbroker.it  This test is no                          t yet approved or cleared by the [place]  FDA and  has been authorized for detection and/or diagnosis of SARS-CoV-2 by FDA under an Emergency Use Authorization (EUA). This EUA will remain  in effect (meaning this test can be used) for the duration of the COVID-19 declaration under Section 564(b)(1) of the Act, 21 U.S.C.section 360bbb-3(b)(1), unless the authorization is terminated  or revoked sooner.       Influenza A by PCR 02/14/2024 NEGATIVE  NEGATIVE Final   Influenza B by PCR 02/14/2024 NEGATIVE  NEGATIVE Final   Comment: (NOTE) The Xpert Xpress SARS-CoV-2/FLU/RSV plus assay is intended as an aid in the diagnosis of influenza from Nasopharyngeal swab specimens and should not be used as a sole  basis for treatment. Nasal washings and aspirates are unacceptable for Xpert Xpress SARS-CoV-2/FLU/RSV testing.  Fact Sheet for Patients: bloggercourse.com  Fact Sheet for Healthcare Providers: seriousbroker.it  This test is not yet approved or cleared by the [place]  FDA and has been authorized for detection and/or diagnosis of SARS-CoV-2 by FDA under an Emergency Use Authorization (EUA). This EUA will remain in effect (meaning this test can be used) for the duration of the COVID-19 declaration under Section 564(b)(1) of the Act, 21 U.S.C. section 360bbb-3(b)(1), unless the authorization is terminated or revoked.     Resp Syncytial Virus by PCR 02/14/2024 NEGATIVE  NEGATIVE Final   Comment: (NOTE) Fact Sheet for Patients: bloggercourse.com  Fact Sheet  for Healthcare Providers: seriousbroker.it  This test is not yet approved or cleared by the [place]  FDA and has been authorized for detection and/or diagnosis of SARS-CoV-2 by FDA under an Emergency Use Authorization (EUA). This EUA will remain in effect (meaning this test can be used) for the duration of the COVID-19 declaration under Section 564(b)(1) of the Act, 21 U.S.C. section 360bbb-3(b)(1), unless the authorization is terminated or revoked.  Performed at [facility], [address], [place], [place] [address]     Blood Alcohol level:  Lab Results  Component Value Date   [facility] <15 02/27/2024   ETH <10 06/01/2023    Metabolic Disorder Labs: No results found for: HGBA1C, MPG No results found for: PROLACTIN Lab Results  Component Value Date   CHOL 148 02/27/2024   TRIG 41 02/27/2024   HDL 54 02/27/2024   CHOLHDL 2.7 02/27/2024   VLDL 8 02/27/2024   LDLCALC 86 02/27/2024    Therapeutic Lab Levels: No results found for: LITHIUM No results found for: VALPROATE No results  found for: CBMZ  Physical Findings   GAD-7    Flowsheet Row Office Visit from 07/06/2023 in [facility]  Total GAD-7 Score 0   PHQ2-9    Flowsheet Row ED from 02/27/2024 in [facility] Office Visit from 07/06/2023 in [facility]  PHQ-2 Total Score 3 0  PHQ-9 Total Score 9 0   Flowsheet Row ED from 02/27/2024 in [facility] Most recent reading at 02/27/2024 11:32 AM ED from 02/27/2024 in [facility] Most recent reading at 02/27/2024  9:38 AM ED from 06/01/2023 in [facility] Most recent reading at 06/01/2023  7:43 PM  C-SSRS RISK CATEGORY No Risk No Risk No Risk     Musculoskeletal  Strength & Muscle Tone: within normal limits Gait & Station: normal Patient leans: N/A  Psychiatric Specialty Exam  Presentation  General Appearance:  Appropriate for Environment; Casual  Eye Contact: Good  Speech: Clear and Coherent; Normal Rate  Speech Volume: Normal  Handedness: Right   Mood and Affect  Mood: Euthymic  Affect: Congruent; Appropriate   Thought Process  Thought Processes: Coherent; Goal Directed; Linear  Descriptions of Associations:Intact  Orientation:Full (Time, Place and Person)  Thought Content:Logical; WDL  Diagnosis of Schizophrenia or Schizoaffective disorder in past: No    Hallucinations:Hallucinations: None  Ideas of Reference:None  Suicidal Thoughts:Suicidal Thoughts: No  Homicidal Thoughts:Homicidal Thoughts: No   Sensorium  Memory: Immediate Good; Recent Fair  Judgment: Fair  Insight: Fair   Art Therapist  Concentration: Good  Attention Span: Good  Recall: Good  Fund of Knowledge: Good  Language: Good   Psychomotor Activity  Psychomotor Activity: Psychomotor Activity: Normal   Assets  Assets: Desire for Improvement; Communication Skills; Financial  Resources/Insurance; Resilience; Social Support; Physical Health   Sleep  Sleep: Sleep: Good  Estimated Sleeping Duration (Last 24 Hours): 12.25-15.50 hours  Nutritional Assessment (For [facility] and [facility] admissions only) Has the patient had a weight loss or gain of 10 pounds or more in the last 3 months?: No Has the patient had a decrease in food intake/or appetite?: No Does the patient have dental problems?: No Does the patient have eating habits or behaviors that may be indicators of an eating disorder including binging or inducing vomiting?: No Has the patient recently lost weight without trying?: 0 Has the patient been eating poorly because of  a decreased appetite?: 0 Malnutrition Screening Tool Score: 0    Physical Exam  Physical Exam Vitals and nursing note reviewed.  Constitutional:      Appearance: Normal appearance. He is well-developed.  HENT:     Head: Normocephalic and atraumatic.     Nose: Nose normal.  Cardiovascular:     Rate and Rhythm: Normal rate.  Pulmonary:     Effort: Pulmonary effort is normal.  Musculoskeletal:        General: Normal range of motion.     Cervical back: Normal range of motion.  Skin:    General: Skin is warm and dry.  Neurological:     Mental Status: He is alert and oriented to person, place, and time.  Psychiatric:        Attention and Perception: Attention and perception normal.        Mood and Affect: Mood and affect normal.        Speech: Speech normal.        Behavior: Behavior normal. Behavior is cooperative.        Thought Content: Thought content normal.        Cognition and Memory: Cognition and memory normal.    Review of Systems  Constitutional: Negative.   HENT: Negative.    Eyes: Negative.   Respiratory: Negative.    Cardiovascular: Negative.   Gastrointestinal: Negative.   Genitourinary: Negative.   Musculoskeletal: Negative.   Skin: Negative.   Neurological: Negative.   Psychiatric/Behavioral:  Positive for  substance abuse.    Blood pressure 110/76, pulse 71, temperature (!) 97.5 F (36.4 C), temperature source Oral, resp. rate 16, SpO2 95%. There is no height or weight on file to calculate BMI.  Treatment Plan Summary: Daily contact with patient to assess and evaluate symptoms and progress in treatment Patient remains voluntary.  [facility].  Medication management: -Discontinue NicoDerm transdermal patch. -Start-Nicorette gum 2mg  oral Q4 PRN/nicotine  withdrawal  Opioid dependence with opioid induced mood disorder: -COWS every 8 hours  Continue medications: -Acetaminophen  650 mg every 6 hours, as needed/mild pain -Maalox 30 mL oral every 4 hours, as needed/digestion -Hydroxyzine 25 mg every 6 hours as needed/anxiety -Magnesium hydroxide 30 mL daily as needed/mild constipation -Trazodone 50 mg nightly, as needed/sleep   Agitation protocol: MILD -Haloperidol 5 mg 3 times daily as needed mild agitation  -Diphenhydramine 50 mg p.o. 3 times daily as needed mild agitation  MODERATE -Haloperidol 5 mg IM 3 times daily as needed/moderate agitation -Diphenhydramine 50 mg IM 3 times daily as needed/moderate agitation -Lorazepam 2 mg IM 3 times daily as needed/moderate agitation  SEVERE -Haloperidol 10 mg IM 3 times daily as needed severe agitation -Diphenhydramine 50 mg IM 3 times daily as needed/severe agitation -Lorazepam 2 mg IM 3 times daily as needed/severe agitation  Clonidine Detox protocol continues: -Clonidine 0.1 mg 4 daily x10 doses, clonidine 0.1 mg every morning and nightly x4 doses, clonidine 0.1 mg daily before breakfast x2 doses -Dicyclomine 20 mg every 6 hours as needed/spasms or abdominal cramping -Diphenhydramine 50 mg oral every 6 hours as needed/itching or allergies -Loperamide 2 to 4 mg oral as needed/diarrhea or loose stools -Methocarbamol 500 mg every 8 hours as needed/muscle spasms -Naproxen 500 mg  twice daily as needed/aching, pain or discomfort -Ondansetron  disintegrating tablet 4mg  every 8 hours as needed/nausea or vomiting   Ellouise LITTIE Dawn, FNP 02/28/2024 1:27 PM

## 2024-02-28 NOTE — Care Management (Addendum)
 [facility] Care Management...  ADDENDUM 2:52  Writer reached out to [employer] [phone]   Writer spoke with Sharolyn @ [facility]   Per Sharolyn, patient is on Do Not Readmit order for [facility] area  Patient currently on probation.   Patient would need to get the ok from probation officer to go outside of [place]   Writer will provide patient with number for [employer] [phone]    Writer met with patient to discuss discharge planning..  Patient reported wanting to get back into a Environmental [facility] provided patient with [facility] packet information and contact number

## 2024-02-28 NOTE — Discharge Instructions (Addendum)
 Plan Of Care/Follow-up recommendations:  Activity: Daily physical activity; reduce sedentary behaviors   Diet: Portion-limited diet rich in produce, whole (minimally processed) grains, nuts/seeds, eggs, beans/legumes, seafood, lowfat dairy (if tolerated), fermented food, lean meat. Copious water intake. Limit (ultra)processed foods and sugar-sweetened beverages.    Other: -Follow-up with your outpatient psychiatric provider -instructions on appointment date, time, and address (location) are provided to you in discharge paperwork.   -Take your psychiatric medications as prescribed at discharge - instructions are provided to you in the discharge paperwork. Take clonidine  one tablet in the morning, one at bedtime on Wed 12/3 and then take on tablet in the morning on Thu 12/4. This will complete your clonidine  taper.  Take your hydroxyzine  25mg  as needed for anxiety. Avoid driving or use of tools/machinery while using hydroxyzine .  Trazodone  available as needed for insomnia.   -Follow-up with outpatient primary care doctor to optimize health maintenance/preventative care and other specialists -for management of chronic medical disease   -Recommend total abstinence from alcohol, tobacco, and other illicit drug use at discharge.    -If your psychiatric symptoms recur, worsen, or if you have side effects to your psychiatric medications, call your outpatient psychiatric provider, [phone] or go to the nearest emergency department.   -If suicidal thoughts occur, immediately call your outpatient psychiatric provider, 911, [phone] or go to the nearest emergency department.

## 2024-02-28 NOTE — ED Notes (Signed)
 Patient is in the bedroom calm and sleeping. NAD Will monitor for safety.

## 2024-02-28 NOTE — ED Notes (Signed)
 Patient is currently eating dinner with latest Cow score of 1. Patient denies any major s/s of withdrawal except for some anxiety and remains cooperative in unit.

## 2024-02-28 NOTE — ED Notes (Signed)
 Patient is in the bedroom calm and sleeping. NAD, Respirations even and unlabored. Will monitor for safety.
